# Patient Record
Sex: Female | Born: 1935 | Race: White | Hispanic: No | Smoking: Never smoker
Health system: Southern US, Community
[De-identification: ages and names within clinical notes are randomized; demographics above are authoritative.]

## PROBLEM LIST (undated history)

## (undated) DIAGNOSIS — M1990 Unspecified osteoarthritis, unspecified site: Secondary | ICD-10-CM

## (undated) DIAGNOSIS — I509 Heart failure, unspecified: Secondary | ICD-10-CM

## (undated) DIAGNOSIS — R42 Dizziness and giddiness: Secondary | ICD-10-CM

## (undated) DIAGNOSIS — E079 Disorder of thyroid, unspecified: Secondary | ICD-10-CM

## (undated) DIAGNOSIS — R011 Cardiac murmur, unspecified: Secondary | ICD-10-CM

## (undated) DIAGNOSIS — Z923 Personal history of irradiation: Secondary | ICD-10-CM

## (undated) DIAGNOSIS — I10 Essential (primary) hypertension: Secondary | ICD-10-CM

## (undated) DIAGNOSIS — D649 Anemia, unspecified: Secondary | ICD-10-CM

## (undated) DIAGNOSIS — C50319 Malignant neoplasm of lower-inner quadrant of unspecified female breast: Secondary | ICD-10-CM

## (undated) HISTORY — DX: Anemia, unspecified: D64.9

## (undated) HISTORY — DX: Essential (primary) hypertension: I10

## (undated) HISTORY — PX: COSMETIC SURGERY: SHX468

## (undated) HISTORY — DX: Dizziness and giddiness: R42

## (undated) HISTORY — PX: APPENDECTOMY: SHX54

## (undated) HISTORY — PX: ABDOMINAL HYSTERECTOMY: SHX81

## (undated) HISTORY — DX: Unspecified osteoarthritis, unspecified site: M19.90

## (undated) HISTORY — DX: Malignant neoplasm of lower-inner quadrant of unspecified female breast: C50.319

## (undated) HISTORY — DX: Heart failure, unspecified: I50.9

## (undated) HISTORY — PX: EYE SURGERY: SHX253

## (undated) HISTORY — DX: Cardiac murmur, unspecified: R01.1

## (undated) HISTORY — PX: ERCP W/ SPHICTEROTOMY: SHX1523

## (undated) HISTORY — DX: Disorder of thyroid, unspecified: E07.9

---

## 2007-04-22 ENCOUNTER — Ambulatory Visit: Payer: Self-pay | Admitting: Family Medicine

## 2007-06-28 ENCOUNTER — Ambulatory Visit: Payer: Self-pay | Admitting: Ophthalmology

## 2007-08-10 ENCOUNTER — Ambulatory Visit: Payer: Self-pay | Admitting: Ophthalmology

## 2008-10-16 ENCOUNTER — Ambulatory Visit: Payer: Self-pay | Admitting: Family Medicine

## 2008-10-24 ENCOUNTER — Ambulatory Visit: Payer: Self-pay | Admitting: Family Medicine

## 2010-01-17 ENCOUNTER — Ambulatory Visit: Payer: Self-pay | Admitting: Family Medicine

## 2010-09-07 DIAGNOSIS — C50319 Malignant neoplasm of lower-inner quadrant of unspecified female breast: Secondary | ICD-10-CM

## 2010-09-07 HISTORY — PX: BREAST BIOPSY: SHX20

## 2010-09-07 HISTORY — DX: Malignant neoplasm of lower-inner quadrant of unspecified female breast: C50.319

## 2010-09-07 HISTORY — PX: BREAST LUMPECTOMY: SHX2

## 2011-04-13 ENCOUNTER — Ambulatory Visit: Payer: Self-pay | Admitting: Family Medicine

## 2011-04-17 ENCOUNTER — Ambulatory Visit: Payer: Self-pay | Admitting: Family Medicine

## 2011-05-25 ENCOUNTER — Ambulatory Visit: Payer: Self-pay | Admitting: General Surgery

## 2011-05-26 LAB — CEA: CEA: 3.8 ng/mL (ref 0.0–4.7)

## 2011-05-26 LAB — CANCER ANTIGEN 27.29: CA 27.29: 31.9 U/mL (ref 0.0–38.6)

## 2011-06-02 ENCOUNTER — Ambulatory Visit: Payer: Self-pay | Admitting: General Surgery

## 2011-06-08 ENCOUNTER — Ambulatory Visit: Payer: Self-pay | Admitting: Radiation Oncology

## 2011-06-24 ENCOUNTER — Ambulatory Visit: Payer: Self-pay | Admitting: Radiation Oncology

## 2011-07-09 ENCOUNTER — Ambulatory Visit: Payer: Self-pay | Admitting: Radiation Oncology

## 2011-08-04 ENCOUNTER — Ambulatory Visit: Payer: Self-pay | Admitting: Internal Medicine

## 2011-08-08 ENCOUNTER — Ambulatory Visit: Payer: Self-pay | Admitting: Radiation Oncology

## 2011-09-08 ENCOUNTER — Ambulatory Visit: Payer: Self-pay | Admitting: Radiation Oncology

## 2011-09-08 HISTORY — PX: COLONOSCOPY: SHX174

## 2011-09-08 HISTORY — PX: UPPER GI ENDOSCOPY: SHX6162

## 2011-09-22 ENCOUNTER — Ambulatory Visit: Payer: Self-pay | Admitting: General Surgery

## 2011-09-29 ENCOUNTER — Ambulatory Visit: Payer: Self-pay | Admitting: General Surgery

## 2011-09-29 LAB — IRON AND TIBC
Iron Bind.Cap.(Total): 420 ug/dL (ref 250–450)
Unbound Iron-Bind.Cap.: 374 ug/dL

## 2011-10-08 ENCOUNTER — Ambulatory Visit: Payer: Self-pay | Admitting: Radiation Oncology

## 2011-10-09 ENCOUNTER — Ambulatory Visit: Payer: Self-pay | Admitting: Internal Medicine

## 2011-10-27 LAB — CBC CANCER CENTER
Basophil #: 0 x10 3/mm (ref 0.0–0.1)
Basophil %: 0.9 %
Eosinophil %: 9.8 %
HCT: 40.8 % (ref 35.0–47.0)
HGB: 13 g/dL (ref 12.0–16.0)
Lymphocyte #: 1.2 x10 3/mm (ref 1.0–3.6)
Lymphocyte %: 22.4 %
MCHC: 31.8 g/dL — ABNORMAL LOW (ref 32.0–36.0)
MCV: 87.7 fL (ref 80–100)
Monocyte #: 0.6 x10 3/mm (ref 0.0–0.7)
Monocyte %: 11.1 %
Neutrophil #: 3.1 x10 3/mm (ref 1.4–6.5)
RBC: 4.65 10*6/uL (ref 3.80–5.20)
RDW: 19.3 % — ABNORMAL HIGH (ref 11.5–14.5)
WBC: 5.4 x10 3/mm (ref 3.6–11.0)

## 2011-10-27 LAB — IRON AND TIBC: Iron: 99 ug/dL (ref 50–170)

## 2011-10-27 LAB — HEPATIC FUNCTION PANEL A (ARMC)
Alkaline Phosphatase: 67 U/L (ref 50–136)
Bilirubin, Direct: 0.1 mg/dL (ref 0.00–0.20)
Bilirubin,Total: 0.4 mg/dL (ref 0.2–1.0)
SGOT(AST): 18 U/L (ref 15–37)

## 2011-10-27 LAB — CREATININE, SERUM: EGFR (Non-African Amer.): 41 — ABNORMAL LOW

## 2011-10-27 LAB — RETICULOCYTES
Absolute Retic Count: 0.1 10*6/uL — ABNORMAL HIGH (ref 0.024–0.084)
Reticulocyte: 2.3 % — ABNORMAL HIGH (ref 0.5–1.5)

## 2011-10-27 LAB — FERRITIN: Ferritin (ARMC): 22 ng/mL (ref 8–388)

## 2011-11-06 ENCOUNTER — Ambulatory Visit: Payer: Self-pay | Admitting: Internal Medicine

## 2011-12-07 ENCOUNTER — Ambulatory Visit: Payer: Self-pay | Admitting: Internal Medicine

## 2012-01-23 IMAGING — CT CT SIM MISC
1 series · 16 of 34 positions shown, 20 images · non-contrast
Comparison: none

[Series 2: — · axial · 1.17mm/px · z∈[-828,-552]mm · 16 of 102 slices shown, 20 images]
[im 8/102  mediastinal]
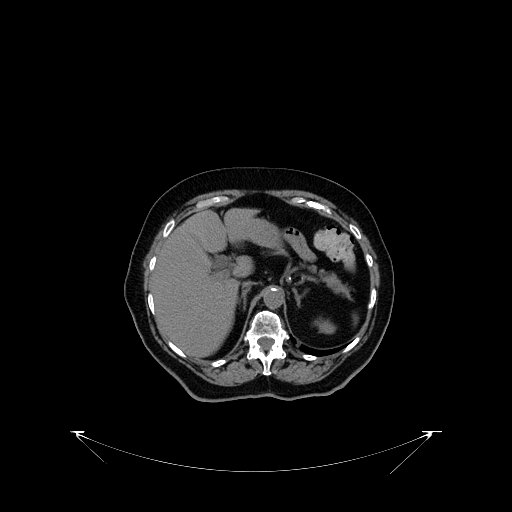
[im 8/102  lung]
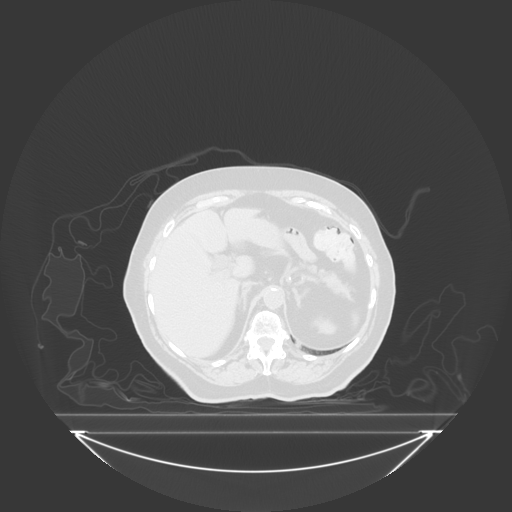
[im 15/102  lung]
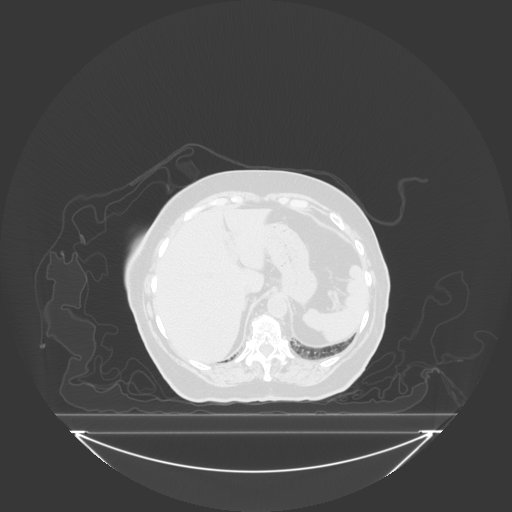
[im 21/102  lung]
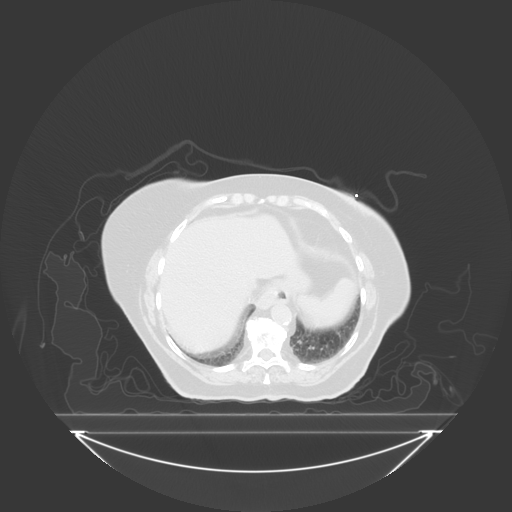
[im 27/102  lung]
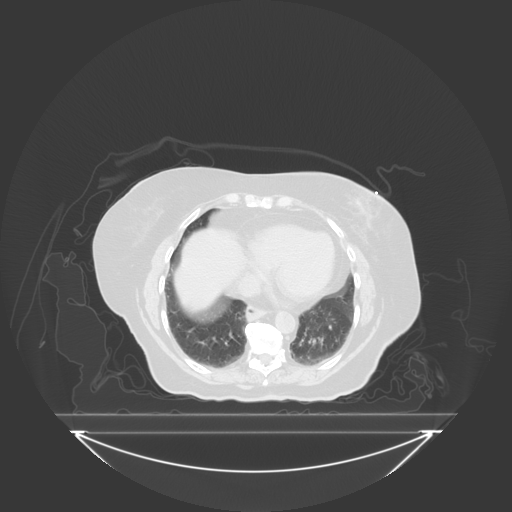
[im 34/102  mediastinal]
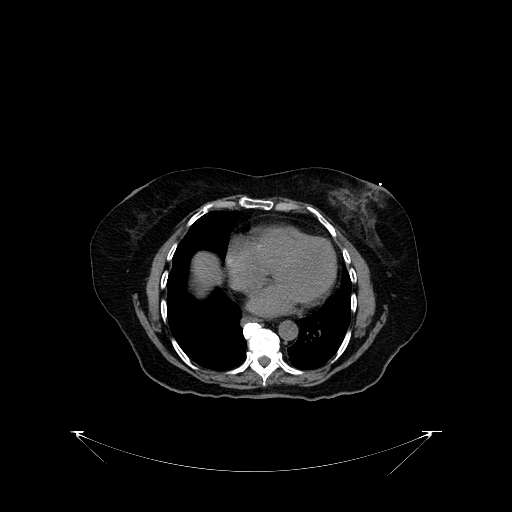
[im 34/102  lung]
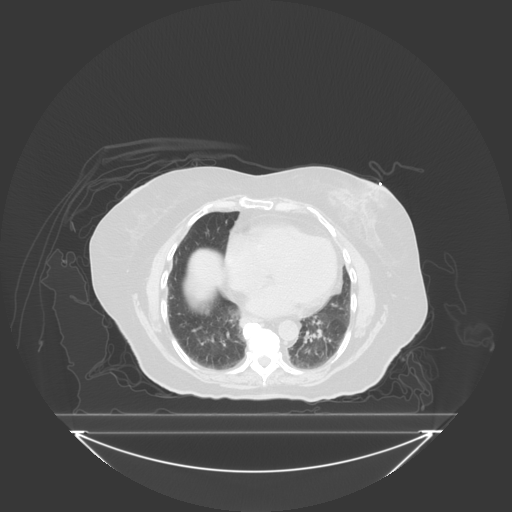
[im 41/102  lung]
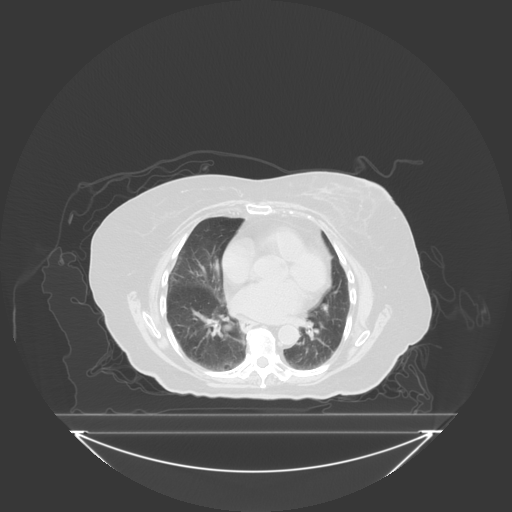
[im 45/102  lung]
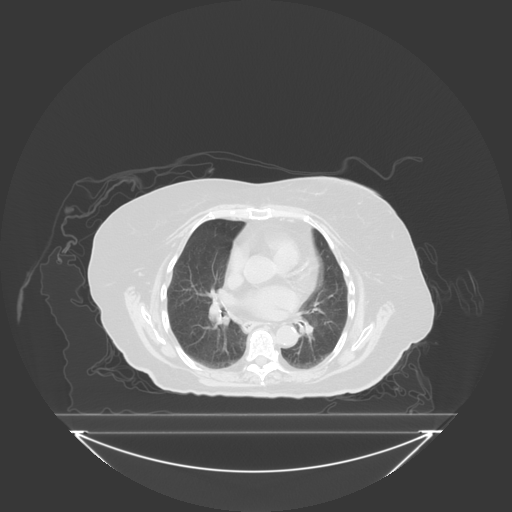
[im 50/102  lung]
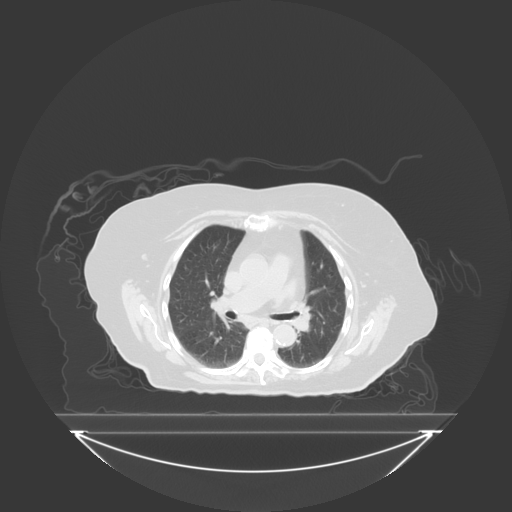
[im 56/102  mediastinal]
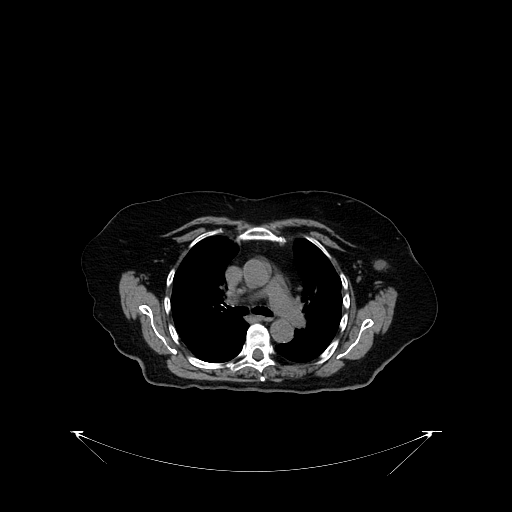
[im 56/102  lung]
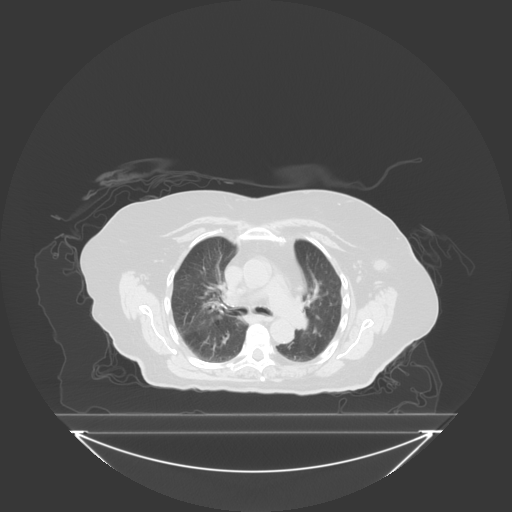
[im 60/102  lung]
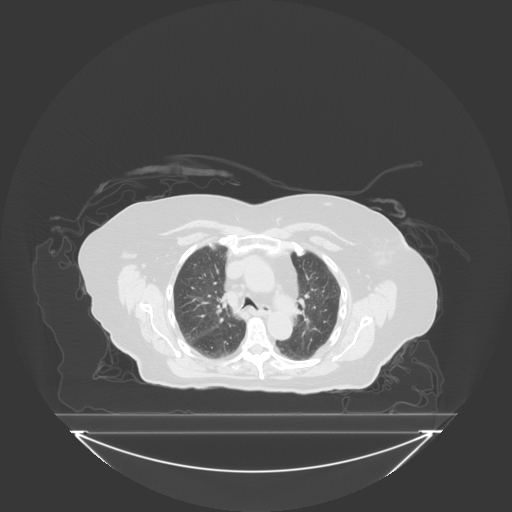
[im 64/102  lung]
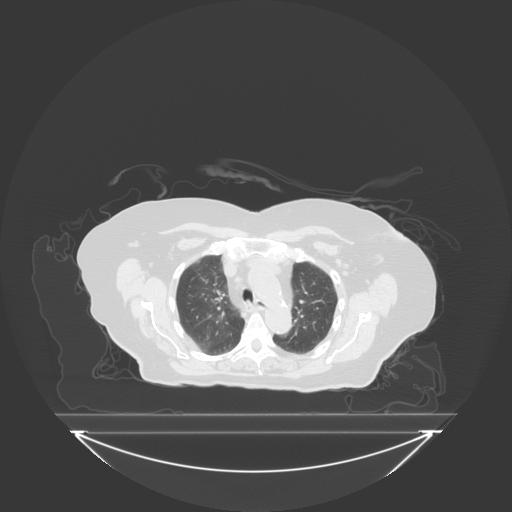
[im 72/102  lung]
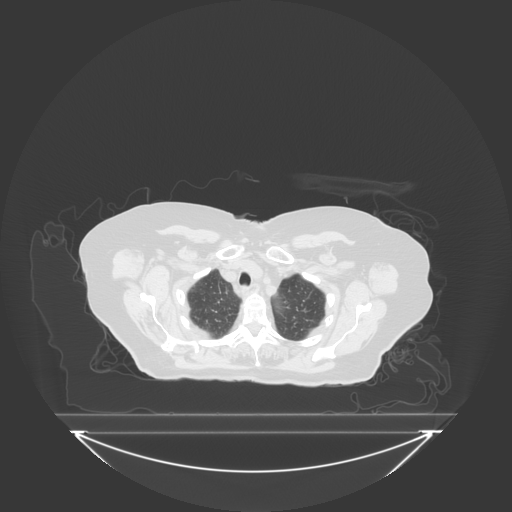
[im 79/102  mediastinal]
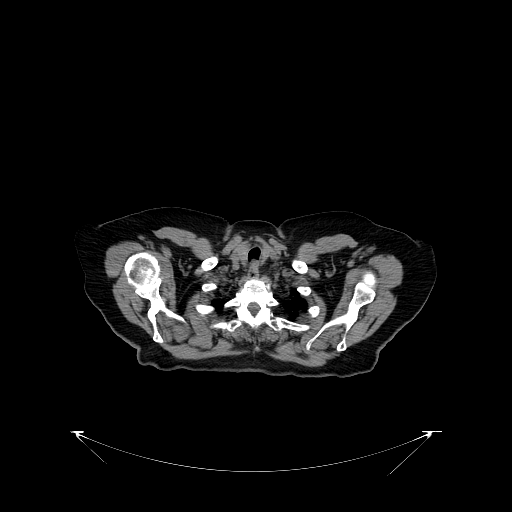
[im 79/102  lung]
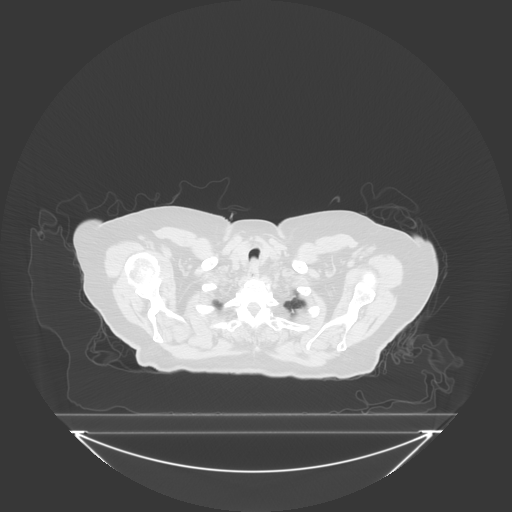
[im 83/102  lung]
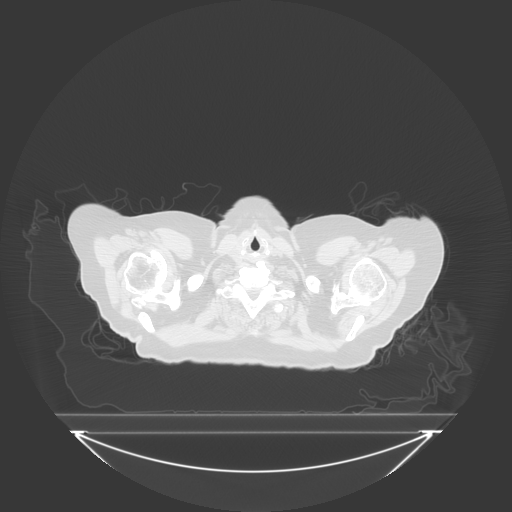
[im 90/102  lung]
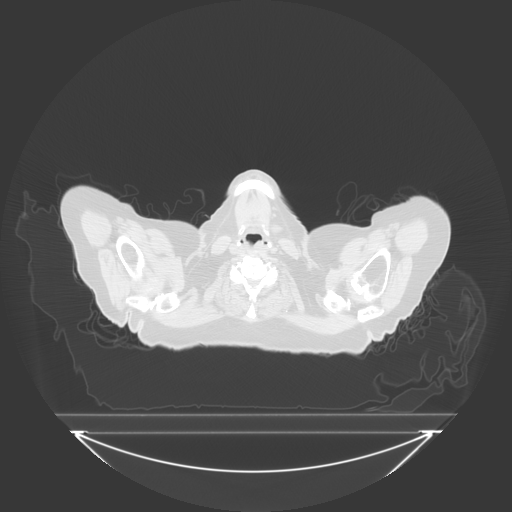
[im 98/102  lung]
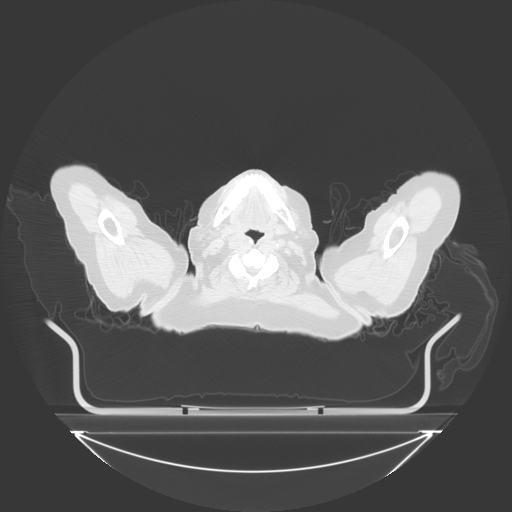

[16 of 34 positions shown; findings below may reference images not displayed]

IMAGES IMPORTED FROM THE [employer] WORKFLOW SYSTEM
NO DICTATION FOR STUDY

## 2012-03-03 ENCOUNTER — Ambulatory Visit: Payer: Self-pay | Admitting: Internal Medicine

## 2012-03-07 ENCOUNTER — Ambulatory Visit: Payer: Self-pay | Admitting: Internal Medicine

## 2012-04-20 ENCOUNTER — Ambulatory Visit: Payer: Self-pay | Admitting: General Surgery

## 2012-05-24 ENCOUNTER — Inpatient Hospital Stay: Payer: Self-pay | Admitting: Specialist

## 2012-05-24 LAB — CBC
HCT: 39.3 % (ref 35.0–47.0)
HGB: 13.1 g/dL (ref 12.0–16.0)
MCHC: 33.3 g/dL (ref 32.0–36.0)
MCV: 93 fL (ref 80–100)
RDW: 14.3 % (ref 11.5–14.5)
WBC: 7.7 10*3/uL (ref 3.6–11.0)

## 2012-05-24 LAB — CK TOTAL AND CKMB (NOT AT ARMC)
CK, Total: 129 U/L (ref 21–215)
CK, Total: 149 U/L (ref 21–215)
CK-MB: 7 ng/mL — ABNORMAL HIGH (ref 0.5–3.6)
CK-MB: 6.5 ng/mL — ABNORMAL HIGH (ref 0.5–3.6)

## 2012-05-24 LAB — BASIC METABOLIC PANEL
Anion Gap: 9 (ref 7–16)
Calcium, Total: 9.3 mg/dL (ref 8.5–10.1)
Chloride: 109 mmol/L — ABNORMAL HIGH (ref 98–107)
Co2: 23 mmol/L (ref 21–32)
Glucose: 100 mg/dL — ABNORMAL HIGH (ref 65–99)
Osmolality: 283 (ref 275–301)
Potassium: 4 mmol/L (ref 3.5–5.1)

## 2012-05-24 LAB — TROPONIN I: Troponin-I: <0.02 ng/mL

## 2012-05-25 LAB — BASIC METABOLIC PANEL
Anion Gap: 10 (ref 7–16)
BUN: 17 mg/dL (ref 7–18)
Calcium, Total: 9.8 mg/dL (ref 8.5–10.1)
Chloride: 106 mmol/L (ref 98–107)
Co2: 28 mmol/L (ref 21–32)
Osmolality: 289 (ref 275–301)
Potassium: 3.6 mmol/L (ref 3.5–5.1)

## 2012-05-25 LAB — CBC WITH DIFFERENTIAL/PLATELET
Basophil %: 1 %
Eosinophil %: 5.1 %
HGB: 12.7 g/dL (ref 12.0–16.0)
Lymphocyte #: 1.6 10*3/uL (ref 1.0–3.6)
MCV: 92 fL (ref 80–100)
Monocyte %: 12.9 %
Neutrophil %: 52.9 %
Platelet: 197 10*3/uL (ref 150–440)
RBC: 4.02 10*6/uL (ref 3.80–5.20)
WBC: 5.7 10*3/uL (ref 3.6–11.0)

## 2012-05-25 LAB — HEPATIC FUNCTION PANEL A (ARMC)
Albumin: 3.6 g/dL (ref 3.4–5.0)
Alkaline Phosphatase: 51 U/L (ref 50–136)
SGOT(AST): 21 U/L (ref 15–37)
SGPT (ALT): 38 U/L (ref 12–78)

## 2012-05-25 LAB — TROPONIN I: Troponin-I: <0.02 ng/mL

## 2012-05-25 LAB — CK TOTAL AND CKMB (NOT AT ARMC)
CK, Total: 139 U/L (ref 21–215)
CK-MB: 4.6 ng/mL — ABNORMAL HIGH (ref 0.5–3.6)

## 2012-05-26 LAB — BASIC METABOLIC PANEL
BUN: 26 mg/dL — ABNORMAL HIGH (ref 7–18)
Calcium, Total: 10.3 mg/dL — ABNORMAL HIGH (ref 8.5–10.1)
Chloride: 103 mmol/L (ref 98–107)
Co2: 30 mmol/L (ref 21–32)
EGFR (African American): 50 — ABNORMAL LOW
Osmolality: 289 (ref 275–301)
Potassium: 3.9 mmol/L (ref 3.5–5.1)
Sodium: 142 mmol/L (ref 136–145)

## 2012-06-24 ENCOUNTER — Inpatient Hospital Stay: Payer: Self-pay | Admitting: Internal Medicine

## 2012-06-24 LAB — URINALYSIS, COMPLETE
Bacteria: NONE SEEN
Bilirubin,UR: NEGATIVE
Blood: NEGATIVE
Glucose,UR: NEGATIVE mg/dL (ref 0–75)
Ketone: NEGATIVE
Nitrite: NEGATIVE
Ph: 5 (ref 4.5–8.0)
Protein: NEGATIVE
RBC,UR: 1 /HPF (ref 0–5)
Specific Gravity: 1.014 (ref 1.003–1.030)
Squamous Epithelial: 2
WBC UR: 3 /HPF (ref 0–5)

## 2012-06-24 LAB — BASIC METABOLIC PANEL
BUN: 122 mg/dL — ABNORMAL HIGH (ref 7–18)
Co2: 22 mmol/L (ref 21–32)
Creatinine: 1.9 mg/dL — ABNORMAL HIGH (ref 0.60–1.30)
EGFR (Non-African Amer.): 24 — ABNORMAL LOW
Glucose: 144 mg/dL — ABNORMAL HIGH (ref 65–99)
Potassium: 4.9 mmol/L (ref 3.5–5.1)
Sodium: 143 mmol/L (ref 136–145)

## 2012-06-24 LAB — COMPREHENSIVE METABOLIC PANEL
Albumin: 3.2 g/dL — ABNORMAL LOW (ref 3.4–5.0)
Anion Gap: 10 (ref 7–16)
BUN: 130 mg/dL — ABNORMAL HIGH (ref 7–18)
Calcium, Total: 8.8 mg/dL (ref 8.5–10.1)
Co2: 23 mmol/L (ref 21–32)
Creatinine: 1.92 mg/dL — ABNORMAL HIGH (ref 0.60–1.30)
EGFR (African American): 27 — ABNORMAL LOW
EGFR (Non-African Amer.): 23 — ABNORMAL LOW
Potassium: 5.6 mmol/L — ABNORMAL HIGH (ref 3.5–5.1)
SGOT(AST): 7 U/L — ABNORMAL LOW (ref 15–37)
SGPT (ALT): 17 U/L (ref 12–78)
Total Protein: 5.9 g/dL — ABNORMAL LOW (ref 6.4–8.2)

## 2012-06-24 LAB — CBC
HGB: 7.9 g/dL — ABNORMAL LOW (ref 12.0–16.0)
MCH: 30.5 pg (ref 26.0–34.0)
MCHC: 33.2 g/dL (ref 32.0–36.0)
MCV: 92 fL (ref 80–100)
Platelet: 197 10*3/uL (ref 150–440)
RDW: 13.6 % (ref 11.5–14.5)
WBC: 12.2 10*3/uL — ABNORMAL HIGH (ref 3.6–11.0)

## 2012-06-24 LAB — HEMOGLOBIN: HGB: 6.8 g/dL — ABNORMAL LOW (ref 12.0–16.0)

## 2012-06-24 LAB — MAGNESIUM: Magnesium: 1.9 mg/dL

## 2012-06-24 LAB — CK TOTAL AND CKMB (NOT AT ARMC)
CK, Total: 79 U/L
CK-MB: 3.3 ng/mL

## 2012-06-24 LAB — APTT: Activated PTT: 24.7 secs (ref 23.6–35.9)

## 2012-06-24 LAB — PRO B NATRIURETIC PEPTIDE: B-Type Natriuretic Peptide: 189 pg/mL (ref 0–450)

## 2012-06-24 LAB — LIPASE, BLOOD: Lipase: 141 U/L (ref 73–393)

## 2012-06-24 LAB — PROTIME-INR: INR: 1

## 2012-06-25 LAB — CBC WITH DIFFERENTIAL/PLATELET
Basophil #: 0.1 x10 3/mm 3
Basophil %: 0.7 %
Eosinophil #: 0.5 x10 3/mm 3
Eosinophil %: 5.5 %
HCT: 27.1 % — ABNORMAL LOW
HGB: 9.3 g/dL — ABNORMAL LOW
Lymphocyte %: 26.8 %
Lymphs Abs: 2.3 x10 3/mm 3
MCH: 30.5 pg
MCHC: 34.1 g/dL
MCV: 89 fL
Monocyte #: 0.8 "x10 3/mm "
Monocyte %: 8.8 %
Neutrophil #: 5 x10 3/mm 3
Neutrophil %: 58.2 %
Platelet: 123 x10 3/mm 3 — ABNORMAL LOW
RBC: 3.04 X10 6/mm 3 — ABNORMAL LOW
RDW: 14.2 %
WBC: 8.6 x10 3/mm 3

## 2012-06-25 LAB — BASIC METABOLIC PANEL
BUN: 108 mg/dL — ABNORMAL HIGH (ref 7–18)
Calcium, Total: 8 mg/dL — ABNORMAL LOW (ref 8.5–10.1)
EGFR (African American): 30 — ABNORMAL LOW
EGFR (Non-African Amer.): 26 — ABNORMAL LOW
Glucose: 119 mg/dL — ABNORMAL HIGH (ref 65–99)
Sodium: 146 mmol/L — ABNORMAL HIGH (ref 136–145)

## 2012-06-25 LAB — HEMOGLOBIN
HGB: 8.7 g/dL — ABNORMAL LOW (ref 12.0–16.0)
HGB: 6.9 g/dL — ABNORMAL LOW (ref 12.0–16.0)

## 2012-06-26 LAB — BASIC METABOLIC PANEL
Anion Gap: 11 (ref 7–16)
BUN: 113 mg/dL — ABNORMAL HIGH (ref 7–18)
BUN: 108 mg/dL — ABNORMAL HIGH (ref 7–18)
Calcium, Total: 7.9 mg/dL — ABNORMAL LOW (ref 8.5–10.1)
Co2: 18 mmol/L — ABNORMAL LOW (ref 21–32)
Creatinine: 1.81 mg/dL — ABNORMAL HIGH (ref 0.60–1.30)
EGFR (African American): 29 — ABNORMAL LOW
EGFR (African American): 30 — ABNORMAL LOW
EGFR (Non-African Amer.): 26 — ABNORMAL LOW
Glucose: 167 mg/dL — ABNORMAL HIGH (ref 65–99)
Osmolality: 327 (ref 275–301)
Potassium: 5.4 mmol/L — ABNORMAL HIGH (ref 3.5–5.1)
Potassium: 4.6 mmol/L (ref 3.5–5.1)
Sodium: 144 mmol/L (ref 136–145)

## 2012-06-26 LAB — CBC WITH DIFFERENTIAL/PLATELET
Basophil #: 0 10*3/uL (ref 0.0–0.1)
Eosinophil #: 0.5 10*3/uL (ref 0.0–0.7)
Eosinophil %: 4.5 %
HGB: 7.6 g/dL — ABNORMAL LOW (ref 12.0–16.0)
Lymphocyte #: 2 10*3/uL (ref 1.0–3.6)
MCH: 30 pg (ref 26.0–34.0)
MCHC: 33.2 g/dL (ref 32.0–36.0)
MCV: 90 fL (ref 80–100)
Monocyte #: 0.9 x10 3/mm (ref 0.2–0.9)
Neutrophil %: 70 %
Platelet: 117 10*3/uL — ABNORMAL LOW (ref 150–440)
RDW: 14.4 % (ref 11.5–14.5)

## 2012-06-27 LAB — BASIC METABOLIC PANEL
Calcium, Total: 7.8 mg/dL — ABNORMAL LOW (ref 8.5–10.1)
Chloride: 117 mmol/L — ABNORMAL HIGH (ref 98–107)
Co2: 22 mmol/L (ref 21–32)
EGFR (African American): 35 — ABNORMAL LOW
EGFR (Non-African Amer.): 30 — ABNORMAL LOW
Glucose: 128 mg/dL — ABNORMAL HIGH (ref 65–99)
Potassium: 4.6 mmol/L (ref 3.5–5.1)
Sodium: 149 mmol/L — ABNORMAL HIGH (ref 136–145)

## 2012-06-27 LAB — CBC WITH DIFFERENTIAL/PLATELET
Basophil %: 0.2 %
Eosinophil %: 0.1 %
HCT: 22.8 % — ABNORMAL LOW (ref 35.0–47.0)
HGB: 7.7 g/dL — ABNORMAL LOW (ref 12.0–16.0)
Lymphocyte #: 1.2 10*3/uL (ref 1.0–3.6)
MCH: 30.6 pg (ref 26.0–34.0)
MCV: 91 fL (ref 80–100)
Monocyte #: 0.8 x10 3/mm (ref 0.2–0.9)
Monocyte %: 5.4 %
Neutrophil %: 86 %
RBC: 2.51 10*6/uL — ABNORMAL LOW (ref 3.80–5.20)
WBC: 14.8 10*3/uL — ABNORMAL HIGH (ref 3.6–11.0)

## 2012-06-28 LAB — CBC WITH DIFFERENTIAL/PLATELET
Eosinophil %: 5.9 %
HCT: 21.1 % — ABNORMAL LOW (ref 35.0–47.0)
HGB: 7 g/dL — ABNORMAL LOW (ref 12.0–16.0)
Lymphocyte #: 1.1 10*3/uL (ref 1.0–3.6)
Lymphocyte %: 10.4 %
MCH: 30.4 pg (ref 26.0–34.0)
MCV: 91 fL (ref 80–100)
Monocyte #: 0.5 x10 3/mm (ref 0.2–0.9)
Monocyte %: 5.3 %
Neutrophil %: 78 %
Platelet: 123 10*3/uL — ABNORMAL LOW (ref 150–440)
RBC: 2.32 10*6/uL — ABNORMAL LOW (ref 3.80–5.20)

## 2012-06-28 LAB — BASIC METABOLIC PANEL
Calcium, Total: 8.1 mg/dL — ABNORMAL LOW (ref 8.5–10.1)
Chloride: 115 mmol/L — ABNORMAL HIGH (ref 98–107)
Co2: 22 mmol/L (ref 21–32)
Creatinine: 1.13 mg/dL (ref 0.60–1.30)
EGFR (Non-African Amer.): 44 — ABNORMAL LOW
Glucose: 153 mg/dL — ABNORMAL HIGH (ref 65–99)
Osmolality: 298 (ref 275–301)
Potassium: 3.8 mmol/L (ref 3.5–5.1)
Sodium: 145 mmol/L (ref 136–145)

## 2012-06-29 LAB — CBC WITH DIFFERENTIAL/PLATELET
Basophil #: 0 10*3/uL (ref 0.0–0.1)
Eosinophil %: 8 %
HCT: 20.7 % — ABNORMAL LOW (ref 35.0–47.0)
HGB: 7.1 g/dL — ABNORMAL LOW (ref 12.0–16.0)
Lymphocyte #: 1.1 10*3/uL (ref 1.0–3.6)
Lymphocyte %: 13.9 %
MCH: 31.1 pg (ref 26.0–34.0)
MCHC: 34.3 g/dL (ref 32.0–36.0)
MCV: 91 fL (ref 80–100)
Monocyte %: 5.8 %
Neutrophil %: 71.9 %
RDW: 14 % (ref 11.5–14.5)

## 2012-06-30 LAB — CBC WITH DIFFERENTIAL/PLATELET
Basophil #: 0 10*3/uL (ref 0.0–0.1)
Basophil %: 0.6 %
Eosinophil #: 0.5 10*3/uL (ref 0.0–0.7)
HCT: 21.7 % — ABNORMAL LOW (ref 35.0–47.0)
HGB: 7.4 g/dL — ABNORMAL LOW (ref 12.0–16.0)
Lymphocyte #: 1.3 10*3/uL (ref 1.0–3.6)
MCHC: 34.1 g/dL (ref 32.0–36.0)
MCV: 90 fL (ref 80–100)
Monocyte #: 0.6 x10 3/mm (ref 0.2–0.9)
Monocyte %: 8.8 %
Neutrophil #: 4 10*3/uL (ref 1.4–6.5)
RBC: 2.41 10*6/uL — ABNORMAL LOW (ref 3.80–5.20)
RDW: 14 % (ref 11.5–14.5)
WBC: 6.4 10*3/uL (ref 3.6–11.0)

## 2012-12-15 IMAGING — CR DG CHEST 2V
1 series · 2 of 2 positions shown · non-contrast
Comparison: none

REASON FOR EXAM: SOB
COMMENTS:

PROCEDURE:     DXR - DXR CHEST PA (OR AP) AND LATERAL  - May 24, 2012  [DATE]
RESULT:     Comparison: 05/25/2011

[Series 2: w chest pa · 0.14mm/px · 2 of 2 slices shown]
[im 1/2]
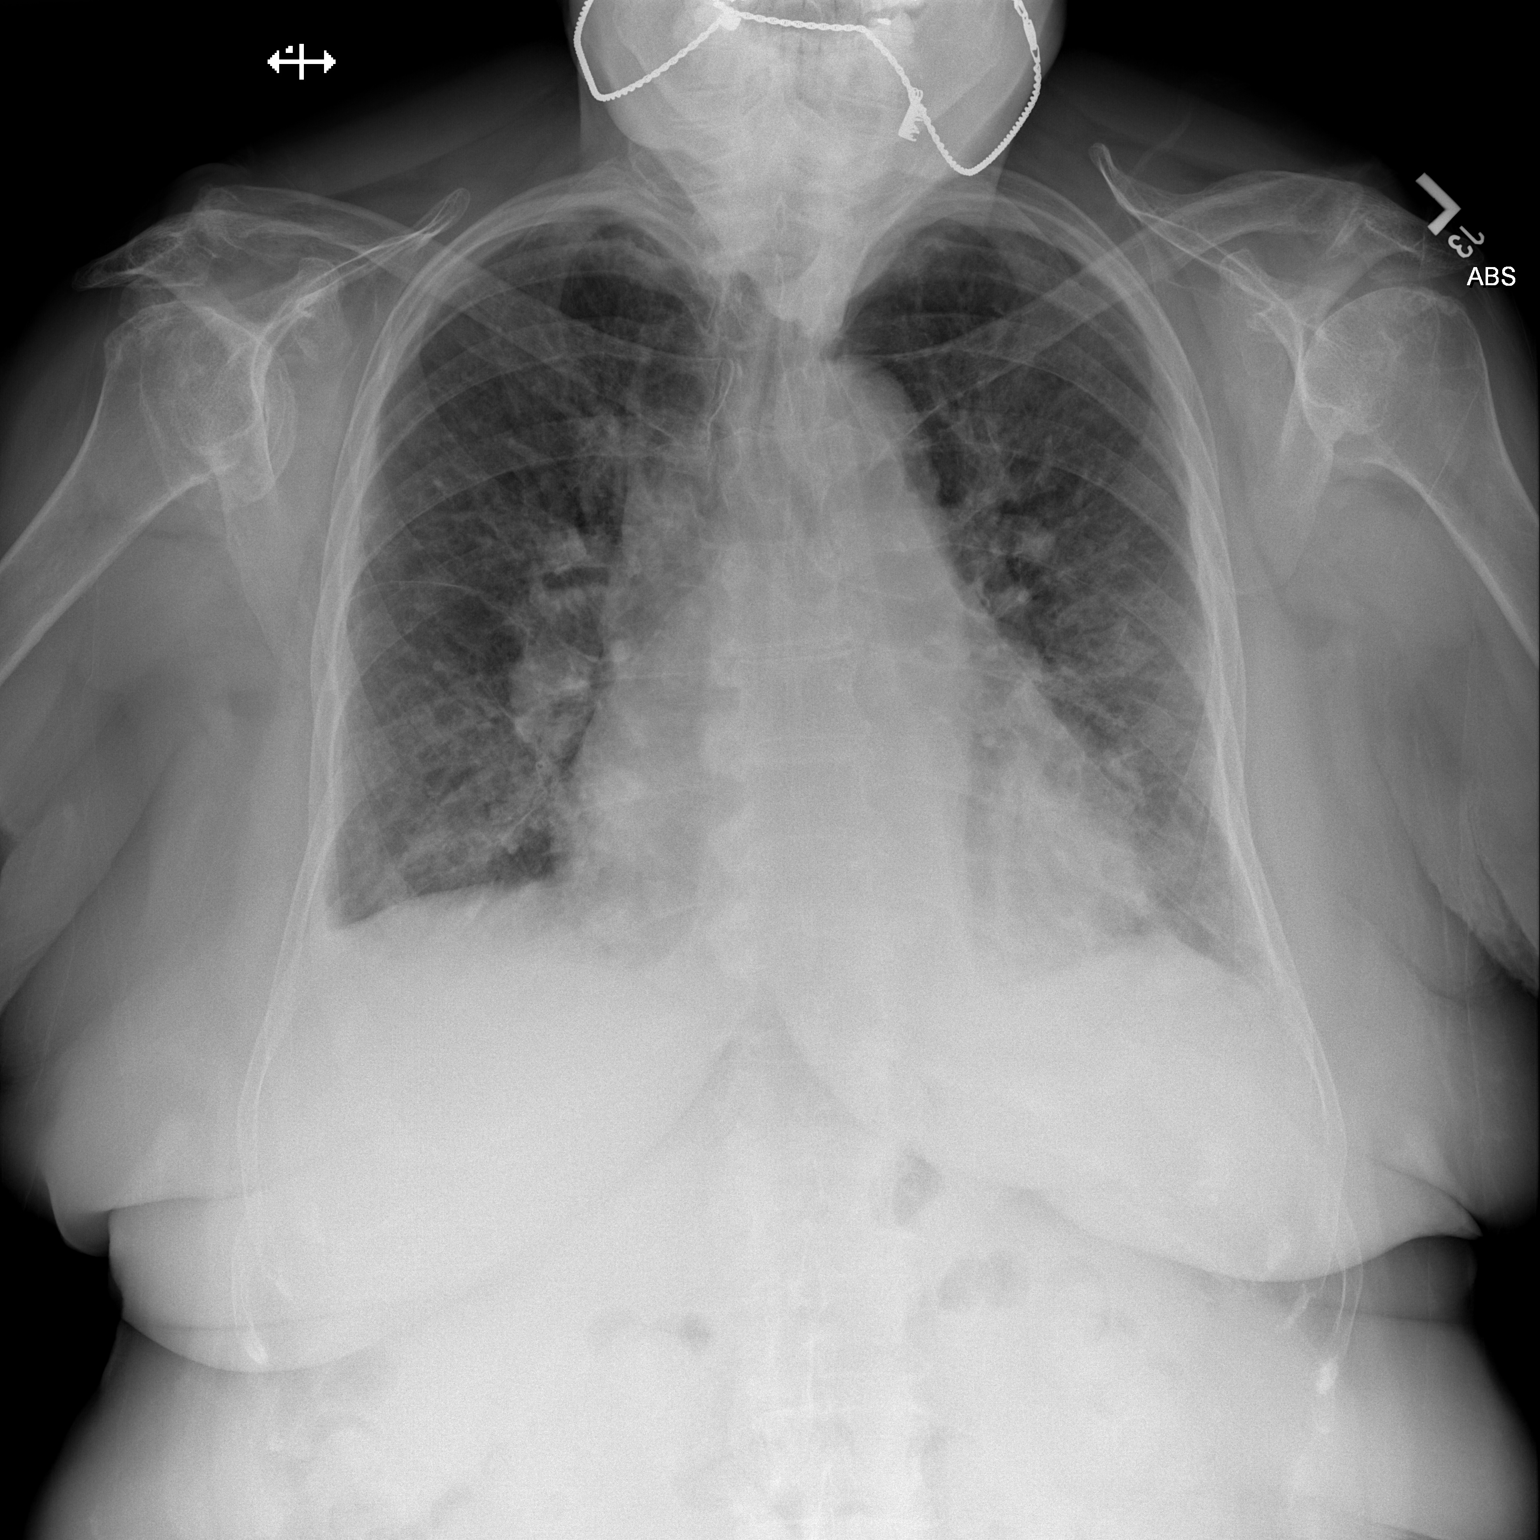
[im 2/2]
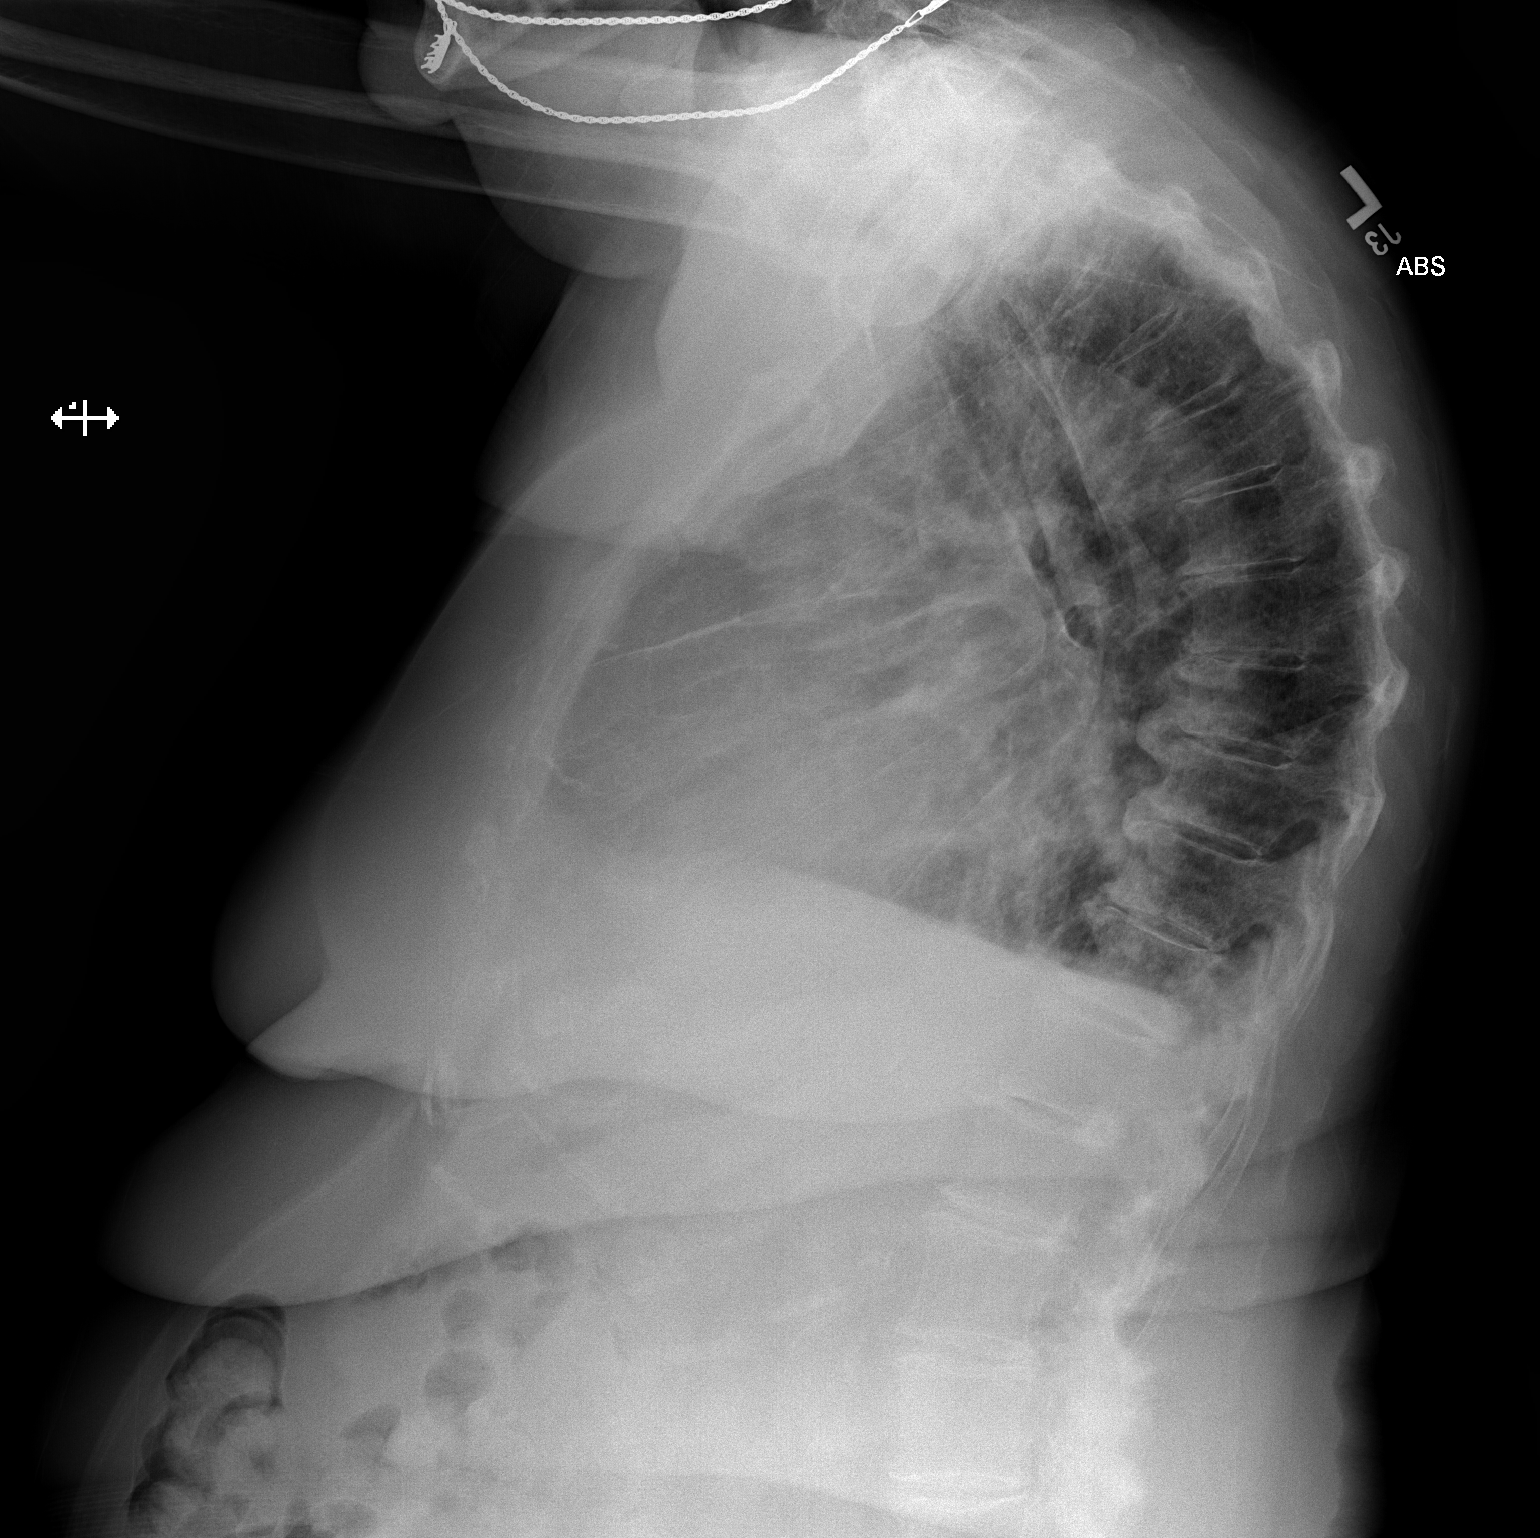

[2 of 2 positions shown; findings below may reference images not displayed]

FINDINGS: Cardiomegaly and the mediastinum are similar to prior. Prominence of the
right hilum is similar to prior likely secondary to enlarged central
pulmonary vasculature. There is bilateral interstitial pulmonary edema.
IMPRESSION: Interstitial pulmonary edema.

[REDACTED]

## 2013-01-15 IMAGING — CR DG CHEST 1V PORT
1 series · 1 of 1 positions shown · non-contrast
Comparison: none

REASON FOR EXAM: sob
COMMENTS:

[ap]
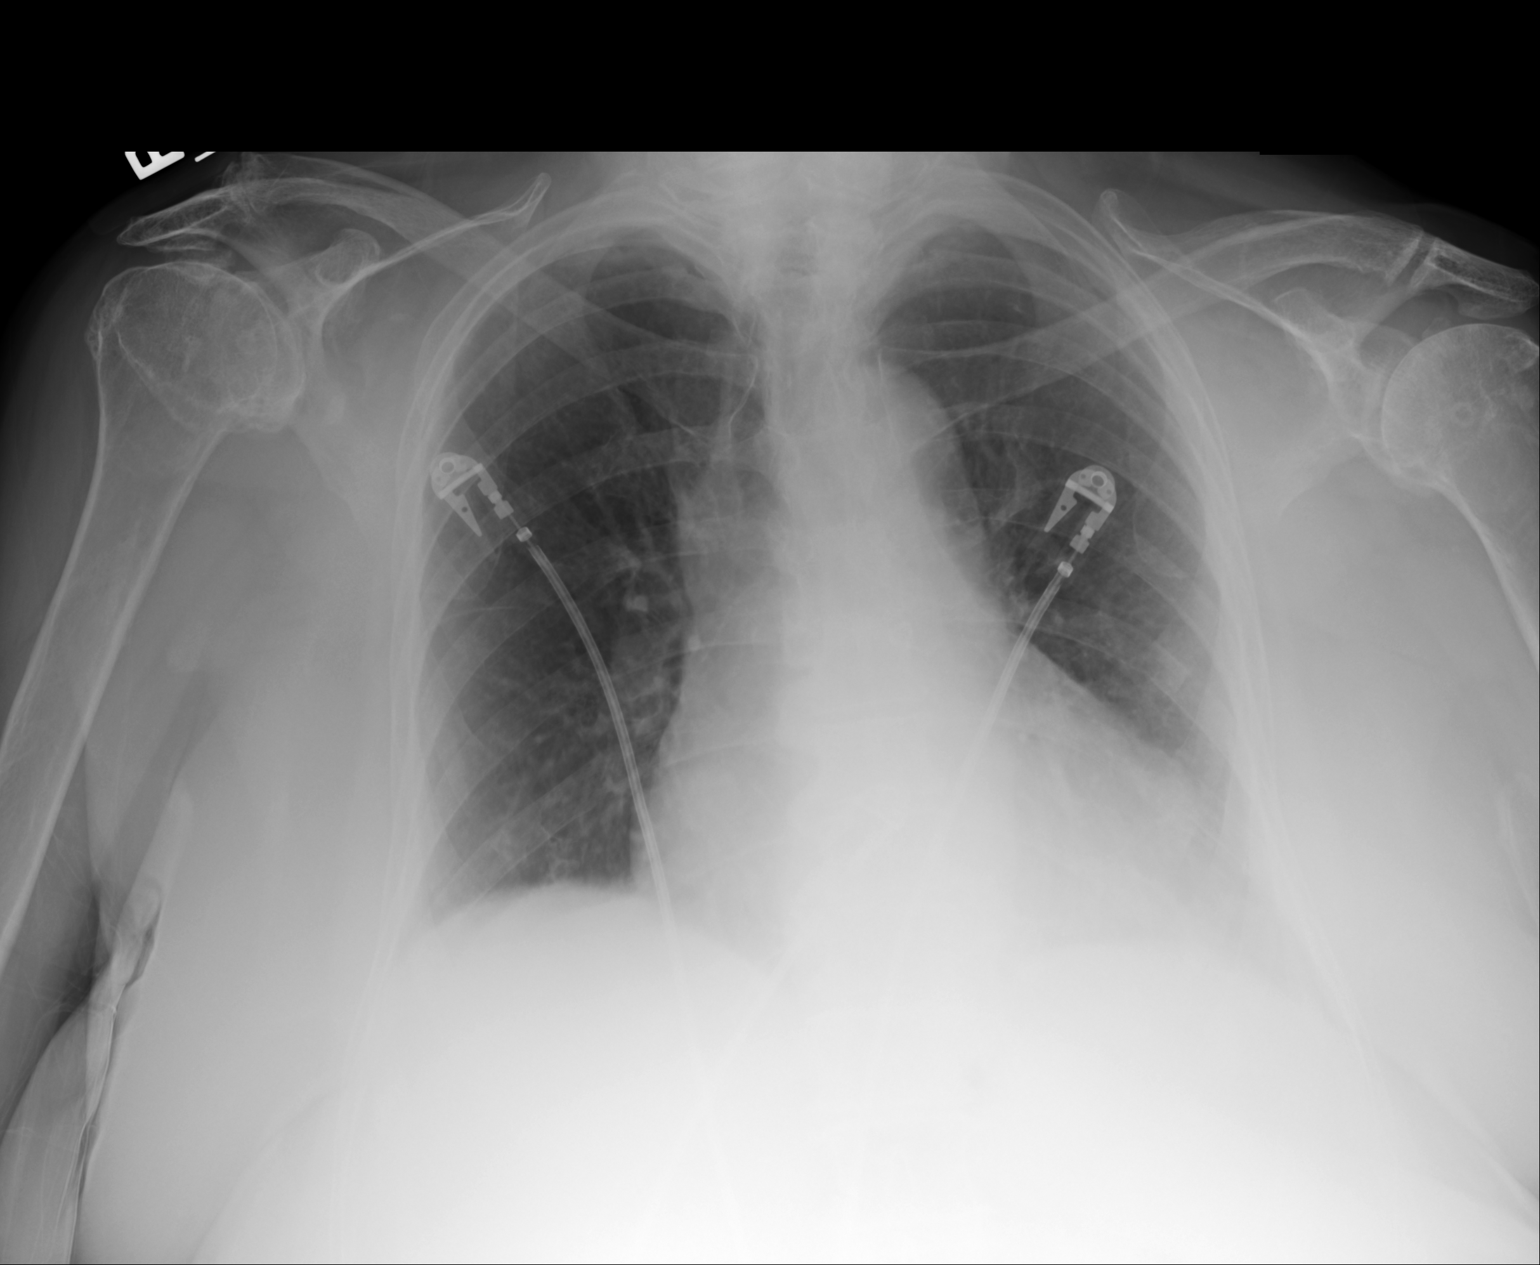

[1 of 1 positions shown; findings below may reference images not displayed]

PROCEDURE:     DXR - DXR PORTABLE CHEST SINGLE VIEW  - June 24, 2012  [DATE]

RESULT:     Comparison is made to the study 24 May, 2012.

The cardiac silhouette is borderline enlarged. Cardiac monitoring electrodes
are present. The lungs are clear. The pulmonary vessels are normal. The bony
and mediastinal structures are unremarkable. There is no effusion. There is
no pneumothorax or evidence of congestive failure.
IMPRESSION: No acute cardiopulmonary disease.

[REDACTED]

## 2013-01-17 IMAGING — US ABDOMEN ULTRASOUND LIMITED
1 series · 8 of 8 positions shown · non-contrast
Comparison: none

REASON FOR EXAM: r/o fluid collection in abdomen? Blood?
COMMENTS:   Body Site: Ascites search - Abd. Quadrants imaged for free fluid

PROCEDURE:     US  - US ABDOMEN LIMITED SURVEY  - June 26, 2012 [DATE]
RESULT:     Ultrasound interrogation of the abdomen was performed in all 4
quadrants. No discrete fluid collections are demonstrated.

[Series 1: abdomen ultrasound limited · 0.31mm/px · 8 of 8 slices shown]
[im 1/8]
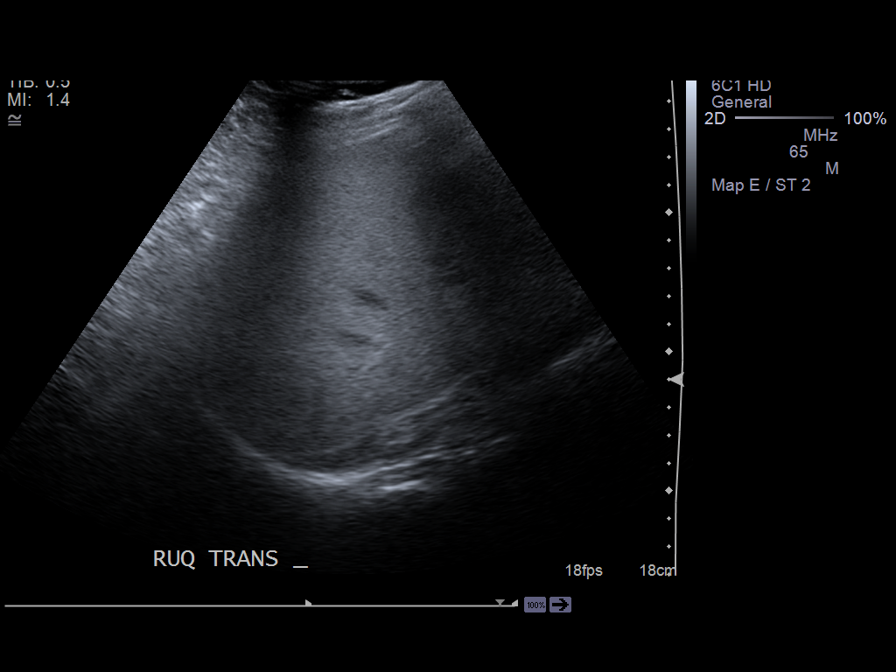
[im 2/8]
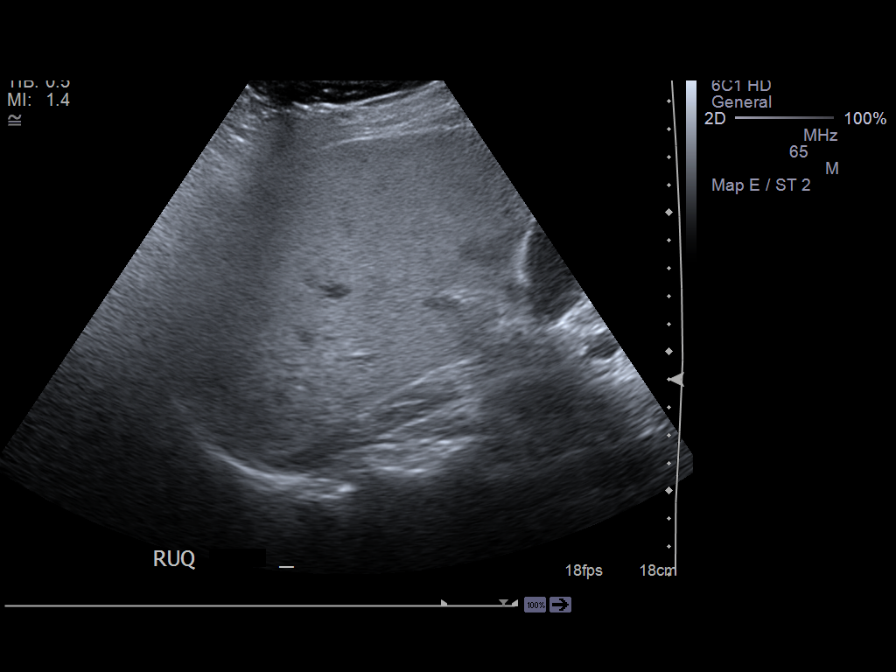
[im 3/8]
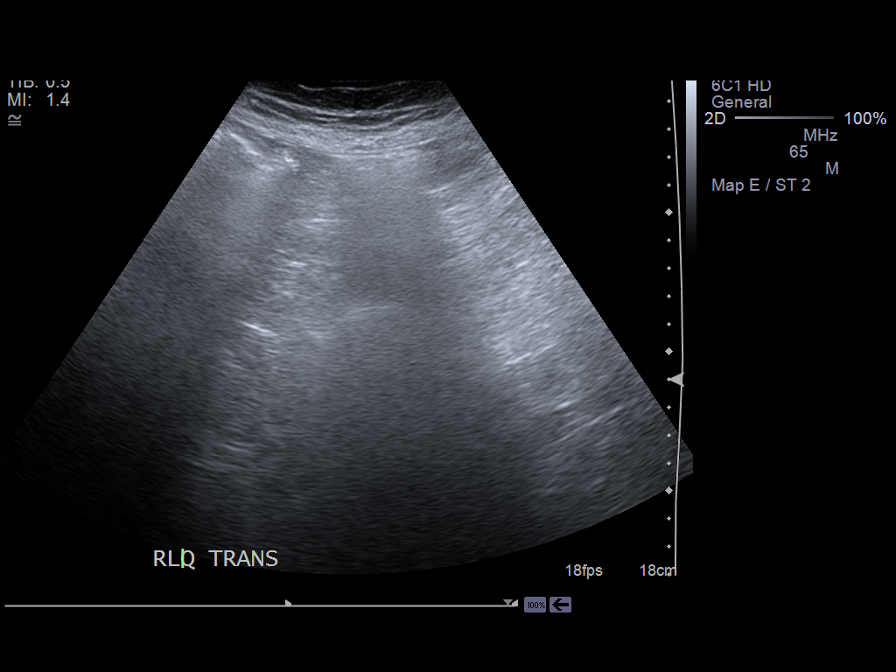
[im 4/8]
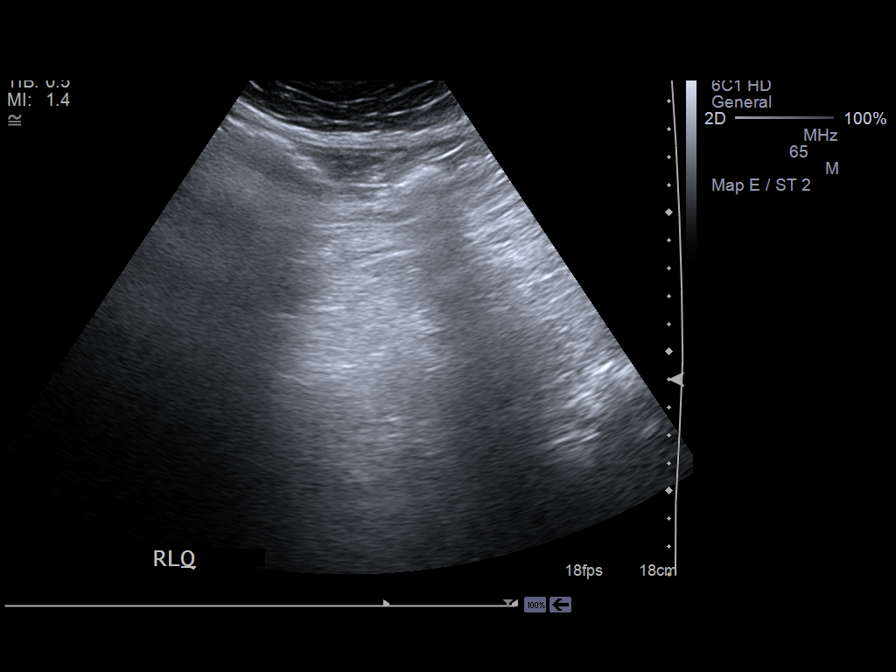
[im 5/8]
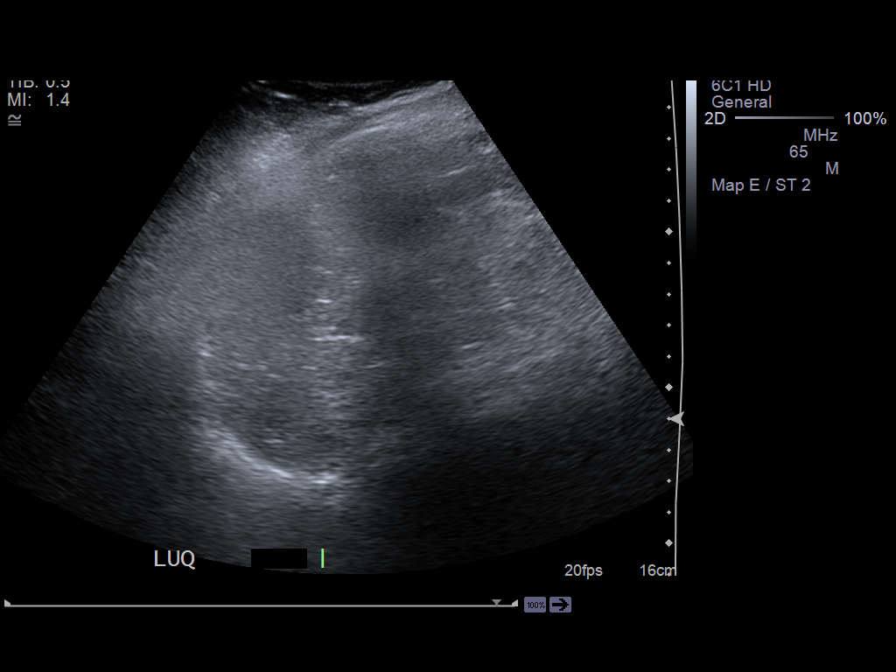
[im 6/8]
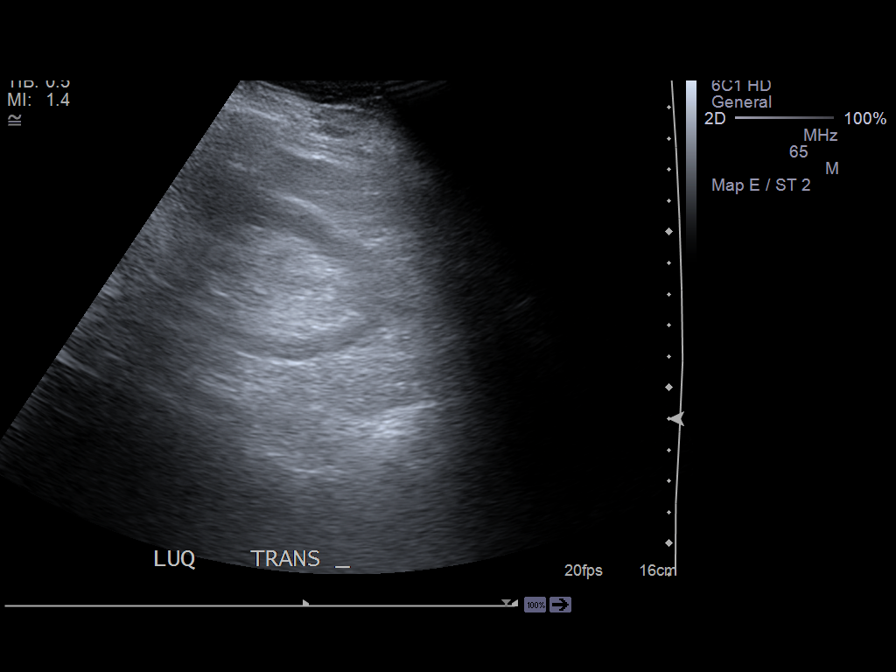
[im 7/8]
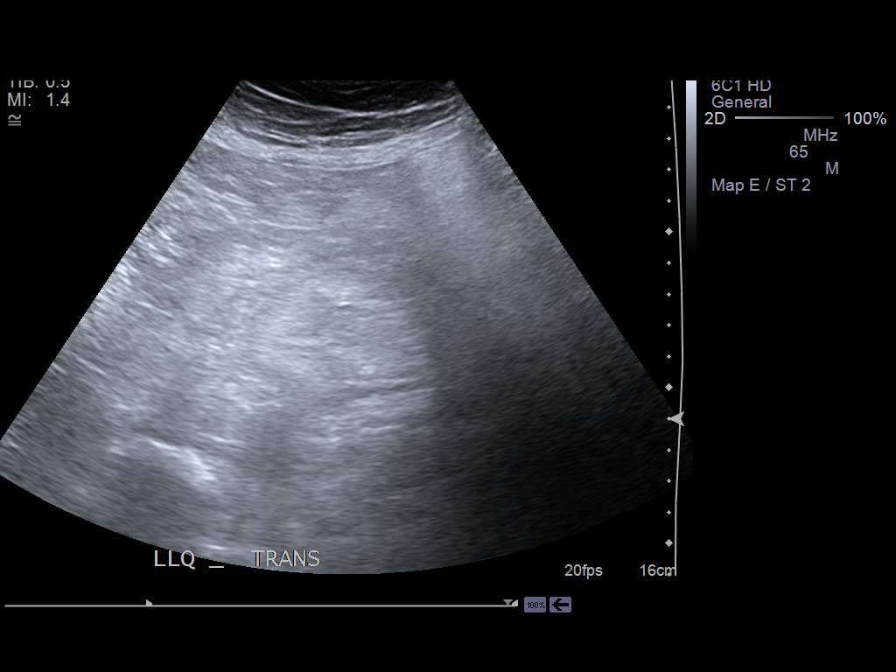
[im 8/8]
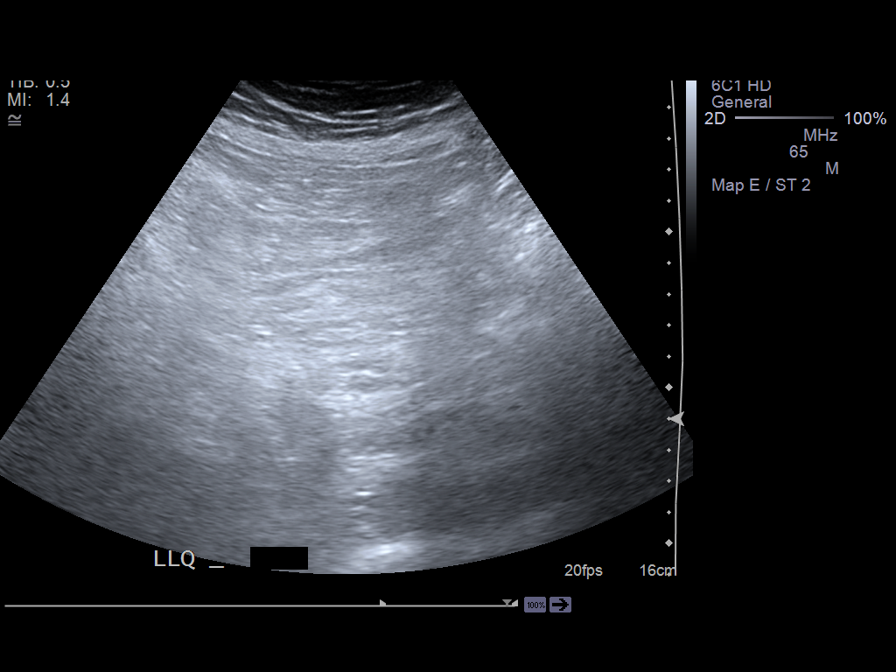

[8 of 8 positions shown; findings below may reference images not displayed]

IMPRESSION: No discrete drainable fluid collections are demonstrated
within the abdomen.

[REDACTED]

## 2013-01-17 IMAGING — CR DG CHEST 1V PORT
1 series · 1 of 1 positions shown · non-contrast
Comparison: none

REASON FOR EXAM: possible aspiration
COMMENTS:

[ap]
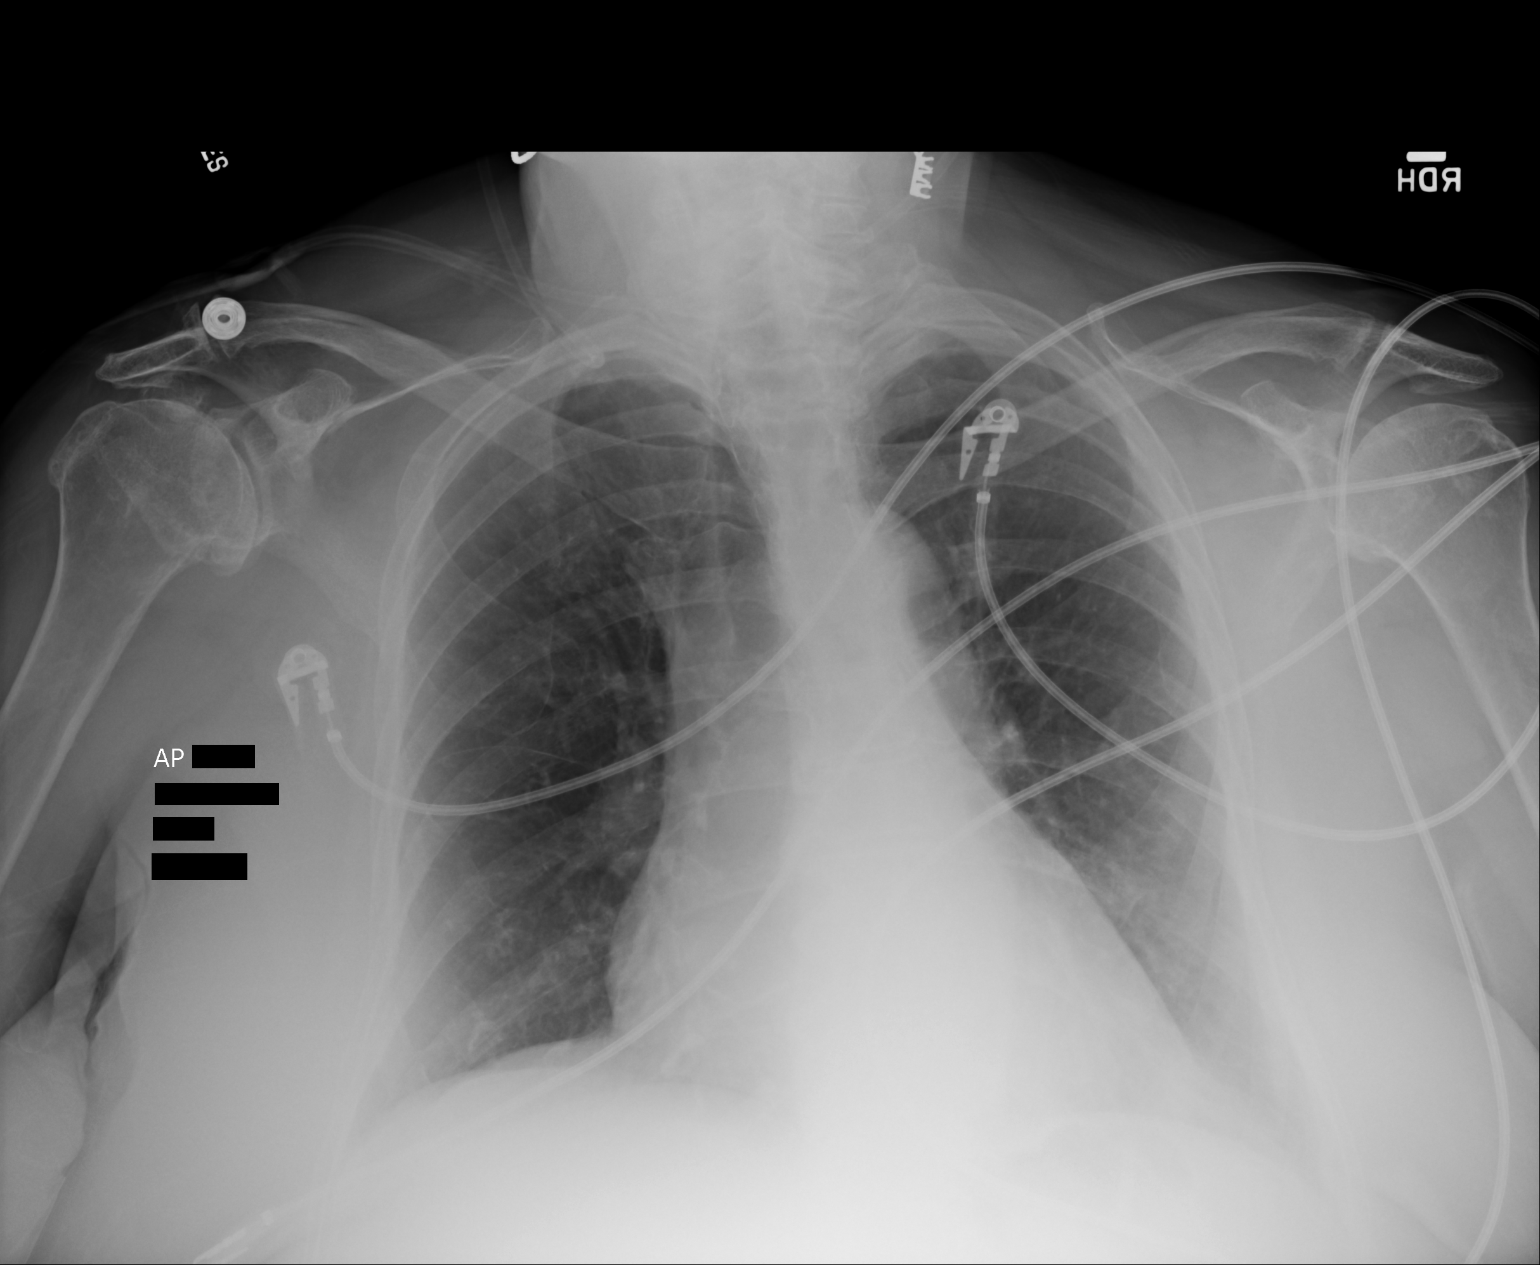

[1 of 1 positions shown; findings below may reference images not displayed]

PROCEDURE:     DXR - DXR PORTABLE CHEST SINGLE VIEW  - June 26, 2012  [DATE]

RESULT:     Comparison is made to the study June 24, 2012.

The lungs are well-expanded. There is no focal infiltrate or evidence of
aspiration. The cardiac silhouette is normal in size. The pulmonary
vascularity is not engorged.
IMPRESSION: There are no findings to suggest aspiration.

[REDACTED]

## 2013-02-24 ENCOUNTER — Ambulatory Visit: Payer: Self-pay | Admitting: Radiation Oncology

## 2013-03-08 ENCOUNTER — Ambulatory Visit: Payer: Self-pay | Admitting: Radiation Oncology

## 2013-04-07 ENCOUNTER — Ambulatory Visit: Payer: Self-pay | Admitting: Radiation Oncology

## 2013-04-26 ENCOUNTER — Ambulatory Visit: Payer: Self-pay | Admitting: General Surgery

## 2013-04-28 ENCOUNTER — Encounter: Payer: Self-pay | Admitting: General Surgery

## 2013-05-03 ENCOUNTER — Ambulatory Visit: Payer: Self-pay | Admitting: General Surgery

## 2013-05-10 ENCOUNTER — Ambulatory Visit (INDEPENDENT_AMBULATORY_CARE_PROVIDER_SITE_OTHER): Payer: Medicare Other | Admitting: General Surgery

## 2013-05-10 ENCOUNTER — Encounter: Payer: Self-pay | Admitting: General Surgery

## 2013-05-10 VITALS — BP 170/72 | HR 86 | Resp 18 | Ht 62.0 in | Wt 174.0 lb

## 2013-05-10 DIAGNOSIS — Z853 Personal history of malignant neoplasm of breast: Secondary | ICD-10-CM | POA: Insufficient documentation

## 2013-05-10 NOTE — Progress Notes (Signed)
Patient ID: Whitney Woods, female   DOB: May 25, 1925, 79 y.o.   MRN: [id number]  Chief Complaint  Patient presents with  . Follow-up    mammogram    HPI Whitney Woods is a 79 y.o. female who presents for her annual breast evaluation and follow up mammogram. The most recent mammogram was done on 04-26-13.  Patient does perform regular self breast checks and gets regular mammograms done.  No new breast issues.   HPI  Past Medical History  Diagnosis Date  . Arthritis 10 years  . Hypertension 10 years  . Heart murmur 10 years  . Thyroid disease   . Malignant neoplasm of lower-inner quadrant of female breast 2012  . Anemia   . Dizziness   . CHF (congestive heart failure)     Dr Gwen Pounds    Past Surgical History  Procedure Laterality Date  . Upper gi endoscopy  2013  . Colonoscopy  2013    Whitney Woods  . Abdominal hysterectomy  age 69     partial  . Appendectomy  age 14  . Breast lumpectomy Left 2012    with radiation    Family History  Problem Relation Age of Onset  . Breast cancer Maternal Grandmother 66    Social History History  Substance Use Topics  . Smoking status: Never Smoker   . Smokeless tobacco: Never Used  . Alcohol Use: No    No Known Allergies  Current Outpatient Prescriptions  Medication Sig Dispense Refill  . hydrochlorothiazide (HYDRODIURIL) 12.5 MG tablet Take 1 tablet by mouth daily.      Marland Kitchen levothyroxine (SYNTHROID, LEVOTHROID) 88 MCG tablet Take 1 tablet by mouth daily.      . pantoprazole (PROTONIX) 40 MG tablet Take 1 tablet by mouth daily.      Marland Kitchen tolterodine (DETROL) 1 MG tablet Take 1 tablet by mouth daily.       No current facility-administered medications for this visit.    Review of Systems Review of Systems  Constitutional: Negative.   Respiratory: Positive for shortness of breath (chronic).   Cardiovascular: Negative.   Neurological: Positive for dizziness (Followed by Dr Jenne Campus).    Blood pressure 170/72, pulse 86,  resp. rate 18, height 5\' 2"  (1.575 m), weight 174 lb (78.926 kg).  Physical Exam Physical Exam  Constitutional: She is oriented to person, place, and time. She appears well-developed and well-nourished.  Cardiovascular: Normal rate and regular rhythm.   Pulmonary/Chest: Effort normal and breath sounds normal. Right breast exhibits no inverted nipple, no mass, no nipple discharge, no skin change and no tenderness.  Volume loss left breast 6 o'clock position  Lymphadenopathy:    She has no cervical adenopathy.    She has no axillary adenopathy.  Neurological: She is alert and oriented to person, place, and time.  Skin: Skin is warm and dry.    Data Reviewed Bilateral mammograms dated 04/26/2013 showed no interval change. BI-RAD-2  Assessment    Stable breast exam. ER/PR-negative tumor.    Plan    We'll plan for followup examination with bilateral mammograms in one year.       Whitney Woods 05/12/2013, 7:27 AM

## 2013-05-10 NOTE — Patient Instructions (Addendum)
Continue self breast exams. Call office for any new breast issues or concerns. One year bilateral diagnostic mammogram and office visit

## 2013-05-12 ENCOUNTER — Encounter: Payer: Self-pay | Admitting: General Surgery

## 2013-05-29 ENCOUNTER — Encounter: Payer: Self-pay | Admitting: General Surgery

## 2013-08-10 ENCOUNTER — Ambulatory Visit: Payer: Self-pay | Admitting: Family Medicine

## 2013-12-12 ENCOUNTER — Encounter: Payer: Self-pay | Admitting: Neurology

## 2014-01-05 ENCOUNTER — Encounter: Payer: Self-pay | Admitting: Neurology

## 2014-02-05 ENCOUNTER — Encounter: Payer: Self-pay | Admitting: Neurology

## 2014-02-18 ENCOUNTER — Observation Stay: Payer: Self-pay | Admitting: Internal Medicine

## 2014-02-18 LAB — CBC
HCT: 40.2 % (ref 35.0–47.0)
HGB: 13.1 g/dL (ref 12.0–16.0)
MCH: 28.7 pg (ref 26.0–34.0)
MCHC: 32.6 g/dL (ref 32.0–36.0)
MCV: 88 fL (ref 80–100)
Platelet: 255 10*3/uL (ref 150–440)
RBC: 4.57 10*6/uL (ref 3.80–5.20)
RDW: 15 % — ABNORMAL HIGH (ref 11.5–14.5)
WBC: 8.9 10*3/uL (ref 3.6–11.0)

## 2014-02-18 LAB — CK TOTAL AND CKMB (NOT AT ARMC)
CK, Total: 154 U/L
CK-MB: 3.6 ng/mL (ref 0.5–3.6)

## 2014-02-18 LAB — BASIC METABOLIC PANEL
Anion Gap: 8 (ref 7–16)
BUN: 90 mg/dL — ABNORMAL HIGH (ref 7–18)
CREATININE: 2.62 mg/dL — AB (ref 0.60–1.30)
Calcium, Total: 9.9 mg/dL (ref 8.5–10.1)
Chloride: 106 mmol/L (ref 98–107)
Co2: 23 mmol/L (ref 21–32)
EGFR (Non-African Amer.): 16 — ABNORMAL LOW
GFR CALC AF AMER: 18 — AB
GLUCOSE: 146 mg/dL — AB (ref 65–99)
Osmolality: 304 (ref 275–301)
Potassium: 4.7 mmol/L (ref 3.5–5.1)
Sodium: 137 mmol/L (ref 136–145)

## 2014-02-18 LAB — PRO B NATRIURETIC PEPTIDE: B-Type Natriuretic Peptide: 76 pg/mL (ref 0–450)

## 2014-02-18 LAB — TROPONIN I

## 2014-02-19 LAB — CBC WITH DIFFERENTIAL/PLATELET
BASOS ABS: 0.1 10*3/uL (ref 0.0–0.1)
Basophil %: 0.7 %
EOS ABS: 0 10*3/uL (ref 0.0–0.7)
Eosinophil %: 0.5 %
HCT: 38.7 % (ref 35.0–47.0)
HGB: 12.2 g/dL (ref 12.0–16.0)
Lymphocyte #: 1.5 10*3/uL (ref 1.0–3.6)
Lymphocyte %: 16.1 %
MCH: 28.5 pg (ref 26.0–34.0)
MCHC: 31.7 g/dL — AB (ref 32.0–36.0)
MCV: 90 fL (ref 80–100)
MONO ABS: 0.9 x10 3/mm (ref 0.2–0.9)
Monocyte %: 10 %
NEUTROS ABS: 6.8 10*3/uL — AB (ref 1.4–6.5)
NEUTROS PCT: 72.7 %
PLATELETS: 217 10*3/uL (ref 150–440)
RBC: 4.3 10*6/uL (ref 3.80–5.20)
RDW: 15.1 % — ABNORMAL HIGH (ref 11.5–14.5)
WBC: 9.3 10*3/uL (ref 3.6–11.0)

## 2014-02-19 LAB — BASIC METABOLIC PANEL
Anion Gap: 7 (ref 7–16)
BUN: 78 mg/dL — ABNORMAL HIGH (ref 7–18)
CO2: 25 mmol/L (ref 21–32)
Calcium, Total: 9.4 mg/dL (ref 8.5–10.1)
Chloride: 108 mmol/L — ABNORMAL HIGH (ref 98–107)
Creatinine: 2.41 mg/dL — ABNORMAL HIGH (ref 0.60–1.30)
EGFR (African American): 20 — ABNORMAL LOW
EGFR (Non-African Amer.): 17 — ABNORMAL LOW
Glucose: 130 mg/dL — ABNORMAL HIGH (ref 65–99)
Osmolality: 304 (ref 275–301)
POTASSIUM: 4.9 mmol/L (ref 3.5–5.1)
Sodium: 140 mmol/L (ref 136–145)

## 2014-02-19 LAB — TROPONIN I: Troponin-I: <0.02 ng/mL

## 2014-02-19 LAB — CK TOTAL AND CKMB (NOT AT ARMC)
CK, Total: 153 U/L
CK-MB: 3.1 ng/mL (ref 0.5–3.6)

## 2014-02-22 ENCOUNTER — Inpatient Hospital Stay: Payer: Self-pay | Admitting: Specialist

## 2014-02-22 LAB — URINALYSIS, COMPLETE
BILIRUBIN, UR: NEGATIVE
BLOOD: NEGATIVE
Glucose,UR: NEGATIVE mg/dL (ref 0–75)
Hyaline Cast: 2
KETONE: NEGATIVE
Nitrite: NEGATIVE
Ph: 5 (ref 4.5–8.0)
Protein: NEGATIVE
RBC,UR: 5 /HPF (ref 0–5)
Specific Gravity: 1.019 (ref 1.003–1.030)
Squamous Epithelial: 16
WBC UR: 35 /HPF (ref 0–5)

## 2014-02-22 LAB — COMPREHENSIVE METABOLIC PANEL
ALK PHOS: 102 U/L
ALT: 172 U/L — AB (ref 12–78)
ANION GAP: 10 (ref 7–16)
AST: 81 U/L — AB (ref 15–37)
Albumin: 3 g/dL — ABNORMAL LOW (ref 3.4–5.0)
BUN: 58 mg/dL — AB (ref 7–18)
Bilirubin,Total: 0.9 mg/dL (ref 0.2–1.0)
CALCIUM: 9.2 mg/dL (ref 8.5–10.1)
CHLORIDE: 100 mmol/L (ref 98–107)
CO2: 22 mmol/L (ref 21–32)
Creatinine: 2.71 mg/dL — ABNORMAL HIGH (ref 0.60–1.30)
EGFR (African American): 18 — ABNORMAL LOW
EGFR (Non-African Amer.): 15 — ABNORMAL LOW
GLUCOSE: 124 mg/dL — AB (ref 65–99)
Osmolality: 282 (ref 275–301)
Potassium: 4.3 mmol/L (ref 3.5–5.1)
SODIUM: 132 mmol/L — AB (ref 136–145)
Total Protein: 7.1 g/dL (ref 6.4–8.2)

## 2014-02-22 LAB — CBC
HCT: 34.3 % — ABNORMAL LOW (ref 35.0–47.0)
HGB: 11.4 g/dL — ABNORMAL LOW (ref 12.0–16.0)
MCH: 29.3 pg (ref 26.0–34.0)
MCHC: 33.3 g/dL (ref 32.0–36.0)
MCV: 88 fL (ref 80–100)
Platelet: 197 10*3/uL (ref 150–440)
RBC: 3.9 10*6/uL (ref 3.80–5.20)
RDW: 15.3 % — AB (ref 11.5–14.5)
WBC: 9.4 10*3/uL (ref 3.6–11.0)

## 2014-02-22 LAB — LIPASE, BLOOD: Lipase: 98 U/L (ref 73–393)

## 2014-02-23 LAB — COMPREHENSIVE METABOLIC PANEL
ALBUMIN: 2.5 g/dL — AB (ref 3.4–5.0)
ANION GAP: 7 (ref 7–16)
AST: 137 U/L — AB (ref 15–37)
Alkaline Phosphatase: 139 U/L — ABNORMAL HIGH
BUN: 35 mg/dL — AB (ref 7–18)
Bilirubin,Total: 0.6 mg/dL (ref 0.2–1.0)
CREATININE: 1.6 mg/dL — AB (ref 0.60–1.30)
Calcium, Total: 8.6 mg/dL (ref 8.5–10.1)
Chloride: 108 mmol/L — ABNORMAL HIGH (ref 98–107)
Co2: 22 mmol/L (ref 21–32)
EGFR (African American): 33 — ABNORMAL LOW
EGFR (Non-African Amer.): 29 — ABNORMAL LOW
Glucose: 78 mg/dL (ref 65–99)
OSMOLALITY: 281 (ref 275–301)
POTASSIUM: 4 mmol/L (ref 3.5–5.1)
SGPT (ALT): 214 U/L — ABNORMAL HIGH (ref 12–78)
SODIUM: 137 mmol/L (ref 136–145)
Total Protein: 6.4 g/dL (ref 6.4–8.2)

## 2014-02-23 LAB — CBC WITH DIFFERENTIAL/PLATELET
BASOS PCT: 0.4 %
Basophil #: 0 10*3/uL (ref 0.0–0.1)
EOS ABS: 0.2 10*3/uL (ref 0.0–0.7)
Eosinophil %: 3.7 %
HCT: 31.4 % — ABNORMAL LOW (ref 35.0–47.0)
HGB: 10.4 g/dL — ABNORMAL LOW (ref 12.0–16.0)
LYMPHS ABS: 1 10*3/uL (ref 1.0–3.6)
LYMPHS PCT: 18.3 %
MCH: 29.5 pg (ref 26.0–34.0)
MCHC: 33.1 g/dL (ref 32.0–36.0)
MCV: 89 fL (ref 80–100)
Monocyte #: 0.9 x10 3/mm (ref 0.2–0.9)
Monocyte %: 16.8 %
NEUTROS PCT: 60.8 %
Neutrophil #: 3.3 10*3/uL (ref 1.4–6.5)
Platelet: 179 10*3/uL (ref 150–440)
RBC: 3.52 10*6/uL — AB (ref 3.80–5.20)
RDW: 15.1 % — AB (ref 11.5–14.5)
WBC: 5.5 10*3/uL (ref 3.6–11.0)

## 2014-02-23 LAB — CLOSTRIDIUM DIFFICILE(ARMC)

## 2014-02-24 LAB — COMPREHENSIVE METABOLIC PANEL
Albumin: 2.3 g/dL — ABNORMAL LOW (ref 3.4–5.0)
Alkaline Phosphatase: 142 U/L — ABNORMAL HIGH
Anion Gap: 8 (ref 7–16)
BUN: 16 mg/dL (ref 7–18)
Bilirubin,Total: 0.6 mg/dL (ref 0.2–1.0)
CREATININE: 1.01 mg/dL (ref 0.60–1.30)
Calcium, Total: 8.2 mg/dL — ABNORMAL LOW (ref 8.5–10.1)
Chloride: 109 mmol/L — ABNORMAL HIGH (ref 98–107)
Co2: 22 mmol/L (ref 21–32)
EGFR (African American): 58 — ABNORMAL LOW
EGFR (Non-African Amer.): 50 — ABNORMAL LOW
Glucose: 90 mg/dL (ref 65–99)
Osmolality: 278 (ref 275–301)
Potassium: 4 mmol/L (ref 3.5–5.1)
SGOT(AST): 90 U/L — ABNORMAL HIGH (ref 15–37)
SGPT (ALT): 202 U/L — ABNORMAL HIGH (ref 12–78)
Sodium: 139 mmol/L (ref 136–145)
Total Protein: 5.1 g/dL — ABNORMAL LOW (ref 6.4–8.2)

## 2014-02-24 LAB — URINE CULTURE

## 2014-02-25 LAB — HEPATIC FUNCTION PANEL A (ARMC)
ALBUMIN: 2.6 g/dL — AB (ref 3.4–5.0)
ALK PHOS: 142 U/L — AB
BILIRUBIN TOTAL: 0.6 mg/dL (ref 0.2–1.0)
Bilirubin, Direct: 0.1 mg/dL (ref 0.00–0.20)
SGOT(AST): 51 U/L — ABNORMAL HIGH (ref 15–37)
SGPT (ALT): 158 U/L — ABNORMAL HIGH (ref 12–78)
TOTAL PROTEIN: 6.6 g/dL (ref 6.4–8.2)

## 2014-02-25 LAB — LIPASE, BLOOD: LIPASE: 76 U/L (ref 73–393)

## 2014-02-26 DIAGNOSIS — K8019 Calculus of gallbladder with other cholecystitis with obstruction: Secondary | ICD-10-CM

## 2014-02-26 LAB — COMPREHENSIVE METABOLIC PANEL
ALT: 125 U/L — AB (ref 12–78)
ANION GAP: 10 (ref 7–16)
AST: 43 U/L — AB (ref 15–37)
Albumin: 2.4 g/dL — ABNORMAL LOW (ref 3.4–5.0)
Alkaline Phosphatase: 121 U/L — ABNORMAL HIGH
BILIRUBIN TOTAL: 0.5 mg/dL (ref 0.2–1.0)
BUN: 11 mg/dL (ref 7–18)
CHLORIDE: 107 mmol/L (ref 98–107)
CREATININE: 0.98 mg/dL (ref 0.60–1.30)
Calcium, Total: 8.9 mg/dL (ref 8.5–10.1)
Co2: 23 mmol/L (ref 21–32)
EGFR (Non-African Amer.): 52 — ABNORMAL LOW
Glucose: 99 mg/dL (ref 65–99)
OSMOLALITY: 279 (ref 275–301)
Potassium: 3.5 mmol/L (ref 3.5–5.1)
SODIUM: 140 mmol/L (ref 136–145)
Total Protein: 6.5 g/dL (ref 6.4–8.2)

## 2014-02-26 LAB — HEMOGLOBIN: HGB: 10.8 g/dL — AB (ref 12.0–16.0)

## 2014-03-04 HISTORY — PX: OTHER SURGICAL HISTORY: SHX169

## 2014-03-06 ENCOUNTER — Ambulatory Visit (INDEPENDENT_AMBULATORY_CARE_PROVIDER_SITE_OTHER): Payer: Medicare Other | Admitting: General Surgery

## 2014-03-06 ENCOUNTER — Encounter: Payer: Self-pay | Admitting: General Surgery

## 2014-03-06 VITALS — BP 118/62 | HR 70 | Resp 14 | Ht 62.0 in | Wt 170.0 lb

## 2014-03-06 DIAGNOSIS — K8011 Calculus of gallbladder with chronic cholecystitis with obstruction: Secondary | ICD-10-CM

## 2014-03-06 DIAGNOSIS — K8010 Calculus of gallbladder with chronic cholecystitis without obstruction: Secondary | ICD-10-CM | POA: Insufficient documentation

## 2014-03-06 DIAGNOSIS — K8050 Calculus of bile duct without cholangitis or cholecystitis without obstruction: Secondary | ICD-10-CM

## 2014-03-06 DIAGNOSIS — K8001 Calculus of gallbladder with acute cholecystitis with obstruction: Secondary | ICD-10-CM

## 2014-03-06 NOTE — Progress Notes (Signed)
Patient ID: Whitney Woods, female   DOB: [birth year], 80 y.o.   MRN: [id number]  Chief Complaint  Patient presents with  . Routine Post Op    gallstones    HPI Whitney Woods is a 80 y.o. female here today to discuss gallbladder surgery. The patient was admitted to the hospital June 18-22, 2015 for abdominal pain with the identification of choledocholithiasis. She underwent ERCP with stone extraction. During her hospitalization she had a formal consultation with Dr. Jamal Collin, and an informal consultation with myself. At that time she was desirous of a time at home prior to making a final decision about elective cholecystectomy.  The patient presents today to discuss elective cholecystectomy. She has had no recurrent episodes of pain.  HPI  Past Medical History  Diagnosis Date  . Arthritis 10 years  . Hypertension 10 years  . Heart murmur 10 years  . Thyroid disease   . Anemia   . Dizziness   . CHF (congestive heart failure)     Dr Nehemiah Massed  . Malignant neoplasm of lower-inner quadrant of female breast 2012    Invasive lobular carcinoma of the left breast, LCIS. 2.2 cm. Negative sentinel node. ER/PR negative., HER-2/neu nonamplified.    Past Surgical History  Procedure Laterality Date  . Upper gi endoscopy  2013  . Colonoscopy  2013    [name]  . Abdominal hysterectomy  age 35     partial  . Appendectomy  age 81  . Breast lumpectomy Left 2012    with radiation  . Gallstones removed  03/04/14  . Eye surgery Bilateral   . Cosmetic surgery      Family History  Problem Relation Age of Onset  . Breast cancer Maternal Grandmother 70    Social History History  Substance Use Topics  . Smoking status: Never Smoker   . Smokeless tobacco: Never Used  . Alcohol Use: No    No Known Allergies  Current Outpatient Prescriptions  Medication Sig Dispense Refill  . aspirin 81 MG tablet Take 81 mg by mouth daily.      Marland Kitchen atenolol (TENORMIN) 25 MG tablet Take 25 mg by mouth  daily.      . Cholecalciferol (VITAMIN D-3) 1000 UNITS CAPS Take 1 capsule by mouth 3 (three) times daily.      . ciprofloxacin (CIPRO) 250 MG tablet Take 250 mg by mouth 3 (three) times a week.      . clobetasol cream (TEMOVATE) 0.81 % Apply 1 application topically 2 (two) times daily.      . ferrous sulfate 325 (65 FE) MG tablet Take 325 mg by mouth daily with breakfast.      . levothyroxine (SYNTHROID, LEVOTHROID) 75 MCG tablet Take 75 mcg by mouth daily before breakfast.      . loperamide (IMODIUM) 2 MG capsule Take 2 mg by mouth as needed for diarrhea or loose stools.      . metoprolol succinate (TOPROL-XL) 100 MG 24 hr tablet Take 100 mg by mouth daily. Take with or immediately following a meal.      . omeprazole (PRILOSEC) 40 MG capsule Take 40 mg by mouth daily.      Marland Kitchen oxybutynin (DITROPAN-XL) 10 MG 24 hr tablet Take 10 mg by mouth at bedtime.      . simvastatin (ZOCOR) 20 MG tablet Take 20 mg by mouth daily.      . vitamin B-12 (CYANOCOBALAMIN) 1000 MCG tablet Take 1,000 mcg by mouth daily.  No current facility-administered medications for this visit.    Review of Systems Review of Systems  Constitutional: Negative.   Respiratory: Negative.   Cardiovascular: Negative.   Gastrointestinal: Negative.     Blood pressure 118/62, pulse 70, resp. rate 14, height 5' 2"  (1.575 m), weight 170 lb (77.111 kg).  Physical Exam Physical Exam  Constitutional: She is oriented to person, place, and time. She appears well-developed and well-nourished.  Eyes: Conjunctivae are normal. No scleral icterus.  Neck: Neck supple.  Cardiovascular: Normal rate, regular rhythm and normal heart sounds.   Pulmonary/Chest: Effort normal and breath sounds normal.  Abdominal: Soft. Normal appearance and bowel sounds are normal.  Neurological: She is alert and oriented to person, place, and time.  Skin: Skin is warm and dry.    Data Reviewed  During her hospitalization she had a suspicion for  urinary tract infection, cultured showed only 60,000 group G. strep. This was treated with Cipro.  CT scan obtained in the emergency department for abdominal pain dated 02/22/2014 was reviewed. Abnormal distention of the gallbladder was identified without cholelithiasis appreciated. (5 x 11 cm). A distinct pancreatic mass was not identified but the pancreatic head was somewhat irregular.  Abdominal ultrasound dated 02/22/2014 showed a common bile duct at 13.8 mm, correlating with a 15 mm common bile duct on CT. Multiple gallbladder sludge without shadowing gallstones. Gallbladder wall thickness 3.4 mm. No pericholecystic fluid.  MRCP dated 02/22/2014 showed a 1 cm common bile duct stone.   Laboratory studies obtained throughout her hospitalization showed mild anemia. No elevation of the bilirubin. Scant elevation of the serum transaminases post ERCP.  ERCP reported multiple stones removed.    Assessment     Reason episode of choledocholithiasis with biliary dilatation.  No clearly identified gallstones on multimodal imaging of the gallbladder.     Plan     the gallbladder ultrasound showed sludge, no stones. No additional findings on CT or MR CP.  She likely has stones, obscured due to to the dilatation of the gallbladder. Prior to surgical intervention, a repeat abdominal ultrasound will be obtained to confirm that cholecystectomy will be of benefit.  The risks associated with elective cholecystectomy including those of bleeding, infection, biliary tract injury and the possibility of an open procedure were reviewed with the patient and her family. She is at high risk due to her age and multiple medical comorbidities, but if ultrasound is confirmed stones the risk will be offset by minimizing the chance for recurrent choledocholithiasis.  He images obtained during her ERCP will be at retrieved for review. If reflux in the gallbladder does indeed show stones preop ultrasound would not be  required.    Patient's surgery has been scheduled for 03-16-14 at [facility].    Robert Bellow 03/06/2014, 8:03 PM

## 2014-03-06 NOTE — Patient Instructions (Addendum)
Cholecystostomy  The gallbladder is a pear-shaped organ that lies beneath the liver on the right side of the body. The gallbladder stores bile, a fluid that helps the body digest fats. However, sometimes bile and other fluids build up in the gallbladder because of an obstruction (for example, a gallstones). This can cause fever, pain, swelling, nausea and other serious symptoms. The procedure used to drain these fluids is called a cholecystostomy. A tube is inserted into the gallbladder. Fluid drains through the tube into a plastic bag outside the body. This procedure is usually done on people who are admitted to the hospital. The procedure is often recommended for people who cannot have gallbladder surgery right away, usually because they are too ill to make it through surgery. The cholecystostomy tube is usually temporary, until surgery can be done. RISKS AND COMPLICATIONS Although rare, complications can include:  Clogging of the tube.  Infection in or around the drain site. Antibiotics might be prescribed for the infection. Or, another tube might be inserted to drain the infected fluid.  Internal bleeding from the liver. BEFORE THE PROCEDURE   Try to quit smoking several weeks before the procedure. Smoking can slow healing.  Arrange for someone to drive you home from the hospital.  Right before your procedure, avoid all foods and liquids after midnight. This includes coffee, tea and water.  On the day of the procedure, arrive early to fill out all the paperwork. PROCEDURE You will be given a sedative to make you sleepy and a local anesthetic to numb the skin. Next, a small cut is made in the abdomen. Then a tube is threaded through the cut into the gallbladder. The procedure is usually done with ultrasound to guide the tube into the gallbladder. Once the tube is in place, the drain is secured to the skin with a stitch. The tube is then connected to a drainage bag.  AFTER THE PROCEDURE    People who have a cholecystostomy usually stay in the hospital for several days because they are so ill. You might not be able to eat for the first few days. Instead, you will be connected to an IV for fluids and nutrients.  The procedure does not cure the blockage that caused the fluid to build up in the first place. Because of this, the gallbladder will need to be removed in the future. The drain is removed at that time. HOME CARE INSTRUCTIONS  Be sure to follow your healthcare Husna Krone's instructions carefully. You may shower but avoid tub baths and swimming until your caregiver says it is OK. Eat and drink according to the directions you have been given. And be sure to make all follow-up appointments.  Call your healthcare Nani Ingram if you notice new pain, redness or swelling around the wound. SEEK IMMEDIATE MEDICAL CARE IF:   There is increased abdominal pain.  Nausea or vomiting occurs.  You develop a fever.  The drainage tube comes out of the abdomen. Document Released: 11/20/2008 Document Revised: 11/16/2011 Document Reviewed: 11/20/2008 [facility] Patient Information 2015 [place], [place]. This information is not intended to replace advice given to you by your health care Zakiyyah Savannah. Make sure you discuss any questions you have with your health care Leif Loflin.  Patient's surgery has been scheduled for 03-16-14 at [facility]

## 2014-03-07 ENCOUNTER — Telehealth: Payer: Self-pay | Admitting: *Deleted

## 2014-03-07 NOTE — Telephone Encounter (Signed)
Message copied by Dominga Ferry on Wed Mar 07, 2014  5:14 PM ------      Message from: [place], [place]      Created: Wed Mar 07, 2014  2:04 PM       Please arrange for an abdominal ultrasound (gallbladder) prior to preadmit date: assess for evidence of cholelithiasis.  If normal, arrange for HIDA scan to follow.  Thanks. ------

## 2014-03-07 NOTE — Telephone Encounter (Signed)
Patient has been scheduled for a limited abdominal ultrasound at [facility] for 03-08-14 at 10 am (arrive 9:45 am). If negative, patient will have a HIDA scan at 11:30 am. Prep: NPO after midnight, no smoking, no gum, and no narcotic pain medication the morning of scan. Patient aware of all instructions and verbalizes understanding.

## 2014-03-08 ENCOUNTER — Telehealth: Payer: Self-pay | Admitting: General Surgery

## 2014-03-08 ENCOUNTER — Ambulatory Visit: Payer: Self-pay | Admitting: General Surgery

## 2014-03-08 ENCOUNTER — Telehealth: Payer: Self-pay | Admitting: *Deleted

## 2014-03-08 ENCOUNTER — Encounter: Payer: Self-pay | Admitting: General Surgery

## 2014-03-08 NOTE — Telephone Encounter (Signed)
Message copied by Dominga Ferry on Thu Mar 08, 2014  3:27 PM ------      Message from: [place], [place] Gleason      Created: Thu Mar 08, 2014  2:48 PM       Cancel planned cholecystectomy (pt aware) and schedule a f/u appt for one month. Thanks.  ------

## 2014-03-08 NOTE — Telephone Encounter (Signed)
Spoke to patient while she was having her HIDA scan. (They were unable to do CCK as an IV site could not be established.  F/U ultrasound shows that gallbladder is much less distended, wall thickness 0.41 cm ( ULN) and CBD smaller at 0.9 cm ( 1.3 prior to ERCP). HIDA shows GB functional.  I think the GB wall thickness is up secondary to the marked distension prior to ERCP.  Patient is presently asymptomatic.  Spoke w/ daughter by phone.  Will put surgery on hold, and arrange for a f/u OV in one month.  The daughter was encouraged to call if any new symptoms develop in the interval.

## 2014-03-08 NOTE — Telephone Encounter (Signed)
Patient aware surgery for 03-16-14 has been cancelled. An appointment for follow up has been arranged for 04-09-14.  Leah in the O.R. has been notified of cancellation.   Message left on the scheduling line to cancel patient's pre-admit appointment for Monday, 03-12-14. Pat in pre-admit has also been notified.

## 2014-03-09 ENCOUNTER — Encounter: Payer: Self-pay | Admitting: Neurology

## 2014-03-13 ENCOUNTER — Ambulatory Visit: Payer: Self-pay | Admitting: Radiation Oncology

## 2014-04-09 ENCOUNTER — Encounter: Payer: Self-pay | Admitting: General Surgery

## 2014-04-09 ENCOUNTER — Ambulatory Visit (INDEPENDENT_AMBULATORY_CARE_PROVIDER_SITE_OTHER): Payer: Medicare Other | Admitting: General Surgery

## 2014-04-09 VITALS — BP 110/68 | HR 80 | Resp 14 | Ht 62.0 in | Wt 168.0 lb

## 2014-04-09 DIAGNOSIS — K8050 Calculus of bile duct without cholangitis or cholecystitis without obstruction: Secondary | ICD-10-CM

## 2014-04-09 NOTE — Progress Notes (Signed)
Patient ID: Whitney Woods, female   DOB: Aug 09, 1925, 80 y.o.   MRN: [id number]  Chief Complaint  Patient presents with  . Abdominal Pain    HPI Whitney Woods is a 80 y.o. female here today for her one month follow up. Patient states she is not having any more abdominal pain. She states she is doing well at this time. HPI  Past Medical History  Diagnosis Date  . Arthritis 10 years  . Hypertension 10 years  . Heart murmur 10 years  . Thyroid disease   . Anemia   . Dizziness   . CHF (congestive heart failure)     Dr Nehemiah Massed  . Malignant neoplasm of lower-inner quadrant of female breast 2012    Invasive lobular carcinoma of the left breast, LCIS. 2.2 cm. Negative sentinel node. ER/PR negative., HER-2/neu nonamplified.    Past Surgical History  Procedure Laterality Date  . Upper gi endoscopy  2013  . Colonoscopy  2013    [name]  . Abdominal hysterectomy  age 23     partial  . Appendectomy  age 30  . Breast lumpectomy Left 2012    with radiation  . Gallstones removed  03/04/14  . Eye surgery Bilateral   . Cosmetic surgery      Family History  Problem Relation Age of Onset  . Breast cancer Maternal Grandmother 70    Social History History  Substance Use Topics  . Smoking status: Never Smoker   . Smokeless tobacco: Never Used  . Alcohol Use: No    No Known Allergies  Current Outpatient Prescriptions  Medication Sig Dispense Refill  . aspirin 81 MG tablet Take 81 mg by mouth daily.      Marland Kitchen atenolol (TENORMIN) 25 MG tablet Take 25 mg by mouth daily.      . Cholecalciferol (VITAMIN D-3) 1000 UNITS CAPS Take 1 capsule by mouth 3 (three) times daily.      . ciprofloxacin (CIPRO) 250 MG tablet Take 250 mg by mouth 3 (three) times a week.      . clobetasol cream (TEMOVATE) 4.50 % Apply 1 application topically 2 (two) times daily.      . ferrous sulfate 325 (65 FE) MG tablet Take 325 mg by mouth daily with breakfast.      . levothyroxine (SYNTHROID, LEVOTHROID) 75  MCG tablet Take 75 mcg by mouth daily before breakfast.      . loperamide (IMODIUM) 2 MG capsule Take 2 mg by mouth as needed for diarrhea or loose stools.      . metoprolol succinate (TOPROL-XL) 100 MG 24 hr tablet Take 100 mg by mouth daily. Take with or immediately following a meal.      . omeprazole (PRILOSEC) 40 MG capsule Take 40 mg by mouth daily.      Marland Kitchen oxybutynin (DITROPAN-XL) 10 MG 24 hr tablet Take 10 mg by mouth at bedtime.      . simvastatin (ZOCOR) 20 MG tablet Take 20 mg by mouth daily.      . vitamin B-12 (CYANOCOBALAMIN) 1000 MCG tablet Take 1,000 mcg by mouth daily.       No current facility-administered medications for this visit.    Review of Systems Review of Systems  Constitutional: Negative.   Respiratory: Negative.   Cardiovascular: Negative.     Blood pressure 110/68, pulse 80, resp. rate 14, height 5' 2"  (1.575 m), weight 168 lb (76.204 kg).  Physical Exam Physical Exam  Constitutional: She is oriented to  person, place, and time. She appears well-developed and well-nourished.  Eyes: Conjunctivae are normal. No scleral icterus.  Neck: Neck supple.  Cardiovascular: Normal rate, regular rhythm and normal heart sounds.   Pulmonary/Chest: Effort normal and breath sounds normal.  Abdominal: Soft. Bowel sounds are normal. There is no tenderness.  Neurological: She is alert and oriented to person, place, and time.  Skin: Skin is warm and dry.    Data Reviewed Abdominal ultrasound dated 03/08/2014 had previously been reviewed. No gallstones identified. Gallbladder wall at upper limits of normal at 4.1 mm. Negative sonographic Murphy sign. Common bile duct 10 mm. (status post ERCP and stone extraction.  Assessment    Previous choledocholithiasis, no evidence of cholelithiasis or symptoms to suggest cholecystitis.    Plan    The patient remained pain-free at this time, and with no stones identified on the gallbladder ultrasound completed last month there is  presently no indication for cholecystectomy.     PCP: Arta Silence 04/10/2014, 12:12 PM

## 2014-04-09 NOTE — Patient Instructions (Signed)
Patient to return as needed. 

## 2014-04-11 ENCOUNTER — Ambulatory Visit: Payer: Self-pay | Admitting: Radiation Oncology

## 2014-05-08 ENCOUNTER — Ambulatory Visit: Payer: Self-pay | Admitting: Radiation Oncology

## 2014-05-16 ENCOUNTER — Ambulatory Visit: Payer: Self-pay | Admitting: Family Medicine

## 2014-05-28 ENCOUNTER — Ambulatory Visit (INDEPENDENT_AMBULATORY_CARE_PROVIDER_SITE_OTHER): Payer: Medicare Other | Admitting: General Surgery

## 2014-05-28 ENCOUNTER — Encounter: Payer: Self-pay | Admitting: General Surgery

## 2014-05-28 VITALS — BP 110/58 | HR 88 | Resp 14 | Ht 62.0 in | Wt 168.0 lb

## 2014-05-28 DIAGNOSIS — Z853 Personal history of malignant neoplasm of breast: Secondary | ICD-10-CM

## 2014-05-28 NOTE — Progress Notes (Signed)
Patient ID: Whitney Woods, female   DOB: 1925/04/20, 80 y.o.   MRN: [id number]  Chief Complaint  Patient presents with  . Follow-up    mammogram    HPI Whitney Woods is a 80 y.o. female who presents for a breast evaluation. The most recent mammogram was done on 05/16/14. Patient does perform regular self breast checks and gets regular mammograms done.  The patient denies any problems with the breasts at this time.   The patient complains of dizziness when standing. No problems with dizziness when sitting or laying down. She has been evaluated by ENT as well as cardiology with no source identified.As she is not experiencing any dizziness well-seated, she continues to drive.   HPI  Past Medical History  Diagnosis Date  . Arthritis 10 years  . Hypertension 10 years  . Heart murmur 10 years  . Thyroid disease   . Anemia   . Dizziness   . CHF (congestive heart failure)     Dr Nehemiah Massed  . Malignant neoplasm of lower-inner quadrant of female breast 2012    Invasive lobular carcinoma of the left breast, LCIS. 2.2 cm. Negative sentinel node. ER/PR negative., HER-2/neu nonamplified.    Past Surgical History  Procedure Laterality Date  . Upper gi endoscopy  2013  . Colonoscopy  2013    [name]  . Abdominal hysterectomy  age 54     partial  . Appendectomy  age 77  . Breast lumpectomy Left 2012    with radiation  . Gallstones removed  03/04/14  . Eye surgery Bilateral   . Cosmetic surgery      Family History  Problem Relation Age of Onset  . Breast cancer Maternal Grandmother 70    Social History History  Substance Use Topics  . Smoking status: Never Smoker   . Smokeless tobacco: Never Used  . Alcohol Use: No    No Known Allergies  Current Outpatient Prescriptions  Medication Sig Dispense Refill  . aspirin 81 MG tablet Take 81 mg by mouth daily.      Marland Kitchen atenolol (TENORMIN) 25 MG tablet Take 25 mg by mouth daily.      . Cholecalciferol (VITAMIN D-3) 1000 UNITS CAPS  Take 1 capsule by mouth 3 (three) times daily.      . clobetasol cream (TEMOVATE) 9.98 % Apply 1 application topically 2 (two) times daily.      . ferrous sulfate 325 (65 FE) MG tablet Take 325 mg by mouth daily with breakfast.      . levothyroxine (SYNTHROID, LEVOTHROID) 75 MCG tablet Take 75 mcg by mouth daily before breakfast.      . lisinopril (PRINIVIL,ZESTRIL) 20 MG tablet Take 20 mg by mouth daily.       Marland Kitchen loperamide (IMODIUM) 2 MG capsule Take 2 mg by mouth as needed for diarrhea or loose stools.      . metoprolol succinate (TOPROL-XL) 100 MG 24 hr tablet Take 100 mg by mouth daily. Take with or immediately following a meal.      . omeprazole (PRILOSEC) 40 MG capsule Take 40 mg by mouth daily.      Marland Kitchen oxybutynin (DITROPAN-XL) 10 MG 24 hr tablet Take 10 mg by mouth at bedtime.      . simvastatin (ZOCOR) 20 MG tablet Take 20 mg by mouth daily.      . vitamin B-12 (CYANOCOBALAMIN) 1000 MCG tablet Take 1,000 mcg by mouth daily.       No current facility-administered medications  for this visit.    Review of Systems Review of Systems  Constitutional: Negative.   Respiratory: Negative.   Cardiovascular: Negative.     Blood pressure 110/58, pulse 88, resp. rate 14, height _0  (1.575 m), weight 168 lb (76.204 kg).  Physical Exam Physical Exam  Constitutional: She is oriented to person, place, and time. She appears well-developed and well-nourished.  Neck: Neck supple. No thyromegaly present.  Cardiovascular: Normal rate, regular rhythm and normal heart sounds.   No murmur heard. Seeded blood pressure was 122/50 with a pulse 105, saturation 95%. Immediately on standing blood pressure remained stable at 124 were 52, pulse increase to 126. Saturation remained normal at 96%. The patient described dizziness at this time. After 3 minutes repeat blood pressure was 118/60, pulse and return to baseline at 105 and saturation remained normal at 96%. Dizziness resolved when she resumed a seated  position.  Pulmonary/Chest: Effort normal and breath sounds normal. Right breast exhibits no inverted nipple, no mass, no nipple discharge, no skin change and no tenderness. Left breast exhibits no inverted nipple, no mass, no nipple discharge, no skin change and no tenderness.  Left breast well healed incision site.   Lymphadenopathy:    She has no cervical adenopathy.    She has no axillary adenopathy.  Neurological: She is alert and oriented to person, place, and time.  Skin: Skin is warm and dry.    Data Reviewed Bilateral mammogram dated May 16, 2014 showed no interval change. BI-RAD-2.  Assessment    Doing well status post management of a stage II invasive lobular carcinoma of the left breast.  Postural dizziness of unknown etiology.       Plan    Will plan for a followup examination in one year with repeat bilateral diagnostic mammograms at that time.     PCP/ Ref. MD: Arta Silence 05/29/2014, 6:28 AM

## 2014-05-28 NOTE — Patient Instructions (Signed)
Patient to return in 1 year for follow up with bilateral mammogram. Continue self breast exams. Call office for any new breast issues or concerns.

## 2014-07-09 ENCOUNTER — Encounter: Payer: Self-pay | Admitting: General Surgery

## 2014-12-25 NOTE — Consult Note (Signed)
Chief Complaint:   Subjective/Chief Complaint No further bleeding since EGD. Received 2 units of PRBC. No abd pain.   VITAL SIGNS/ANCILLARY NOTES: **Vital Signs.:   19-Oct-13 08:00   Pulse Pulse 84   Respirations Respirations 18   Systolic BP Systolic BP 119   Diastolic BP (mmHg) Diastolic BP (mmHg) 40   Mean BP 66   Pulse Ox % Pulse Ox % 100   Pulse Ox Activity Level  At rest   Oxygen Delivery 2L; Nasal Cannula   Pulse Ox Heart Rate 84   Brief Assessment:   Cardiac Regular    Respiratory clear BS    Gastrointestinal Normal   Lab Results: Routine Chem:  19-Oct-13 05:01    Result Comment LABS - This specimen was collected through an   - indwelling catheter or arterial line.  - A minimum of 5mls of blood was wasted prior    - to collecting the sample.  Interpret  - results with caution.  Result(s) reported on 25 Jun 2012 at 05:47AM.   Glucose, Serum  119   BUN  108   Creatinine (comp)  1.75   Sodium, Serum  146   Potassium, Serum 5.1   Chloride, Serum  114   CO2, Serum 24   Calcium (Total), Serum  8.0   Anion Gap 8   Osmolality (calc) 326   eGFR (African American)  30   eGFR (Non-African American)  26 (eGFR values <60mL/min/1.73 m2 may be an indication of chronic kidney disease (CKD). Calculated eGFR is useful in patients with stable renal function. The eGFR calculation will not be reliable in acutely ill patients when serum creatinine is changing rapidly. It is not useful in  patients on dialysis. The eGFR calculation may not be applicable to patients at the low and high extremes of body sizes, pregnant women, and vegetarians.)  Routine Hem:  19-Oct-13 05:01    WBC (CBC) 8.6   RBC (CBC)  3.04   Hemoglobin (CBC)  9.3   Hematocrit (CBC)  27.1   Platelet Count (CBC)  123   MCV 89   MCH 30.5   MCHC 34.1   RDW 14.2   Neutrophil % 58.2   Lymphocyte % 26.8   Monocyte % 8.8   Eosinophil % 5.5   Basophil % 0.7   Neutrophil # 5.0   Lymphocyte # 2.3    Monocyte # 0.8   Eosinophil # 0.5   Basophil # 0.1   Assessment/Plan:  Assessment/Plan:   Assessment Acute UGI bleeding. No active bleeding now. Surprising that patient did not have gross gastric ulcers despite long term BC powder use.    Plan Continue to moniter hgb and transfuse as needed. Continue IV protonix. Ok to try clear liquid today. May consider repeating EGD on Monday if no obvious bleeding site seen. Thanks   Electronic Signatures: ,  (MD)  (Signed 19-Oct-13 08:56)  Authored: Chief Complaint, VITAL SIGNS/ANCILLARY NOTES, Brief Assessment, Lab Results, Assessment/Plan   Last Updated: 19-Oct-13 08:56 by ,  (MD) 

## 2014-12-25 NOTE — Consult Note (Signed)
Chief Complaint:   Subjective/Chief Complaint Some more bleeding yest AM, mostly old blood. Hgb dropped.   VITAL SIGNS/ANCILLARY NOTES: **Vital Signs.:   20-Oct-13 08:00   Pulse Pulse 118   Respirations Respirations 25   Systolic BP Systolic BP 951   Diastolic BP (mmHg) Diastolic BP (mmHg) 39   Mean BP 66   Pulse Ox % Pulse Ox % 94   Pulse Ox Activity Level  At rest   Oxygen Delivery 2L; Nasal Cannula   Pulse Ox Heart Rate 120   Brief Assessment:   Cardiac Regular    Respiratory clear BS    Gastrointestinal Normal   Lab Results: Routine Chem:  20-Oct-13 03:29    Result Comment CBC - SPECIMEN CLOTTED  Result(s) reported on 26 Jun 2012 at 05:01AM.   Glucose, Serum  136   BUN  113   Creatinine (comp)  1.81   Sodium, Serum 144   Potassium, Serum  5.4   Chloride, Serum  115   CO2, Serum  18   Calcium (Total), Serum  7.7   Anion Gap 11   Osmolality (calc) 325   eGFR (African American)  29   eGFR (Non-African American)  25 (eGFR values <22m/min/1.73 m2 may be an indication of chronic kidney disease (CKD). Calculated eGFR is useful in patients with stable renal function. The eGFR calculation will not be reliable in acutely ill patients when serum creatinine is changing rapidly. It is not useful in  patients on dialysis. The eGFR calculation may not be applicable to patients at the low and high extremes of body sizes, pregnant women, and vegetarians.)  Routine Hem:  20-Oct-13 03:29    WBC (CBC) -   RBC (CBC) -   Hemoglobin (CBC) -   Hematocrit (CBC) -   Platelet Count (CBC) -   MCV -   MCH -   MCHC -   RDW -   Neutrophil % -   Lymphocyte % -   Monocyte % -   Eosinophil % -   Basophil % -   Neutrophil # -   Lymphocyte # -   Monocyte # -   Eosinophil # -   Basophil # -   Bands -   Segmented Neutrophils -   Lymphocytes -   Variant Lymphocytes -   Monocytes -   Eosinophil -   Basophil -   Metamyelocyte -   Myelocyte -   Promyelocyte -   Blast-Like -    Other Cells -   NRBC -   Diff Comment 1 -   Diff Comment 2 -   Diff Comment 3 -   Diff Comment 4 -   Diff Comment 5 -   Diff Comment 6 -   Diff Comment 7 -   Diff Comment 8 -   Diff Comment 9 -   Diff Comment 10 - (Result(s) reported on 26 Jun 2012 at 05:01AM.)   Assessment/Plan:  Assessment/Plan:   Assessment UGI bleed.    Plan Keep hgb above 7 at least. Continue PPI. If more bleeding, add octreotide drip. Plan EGD tomorrow AM to visualize upper part of stomach. Keep on clears rest of today. Thanks   Electronic Signatures: OVerdie Shire(MD)  (Signed 20-Oct-13 08:51)  Authored: Chief Complaint, VITAL SIGNS/ANCILLARY NOTES, Brief Assessment, Lab Results, Assessment/Plan   Last Updated: 20-Oct-13 08:51 by OVerdie Shire(MD)

## 2014-12-25 NOTE — Discharge Summary (Signed)
PATIENT NAME:  Whitney Woods, Whitney Woods MR#:  [id number] DATE OF BIRTH:  1924/11/02  DATE OF ADMISSION:  06/24/2012 DATE OF DISCHARGE:  06/30/2012  ADDENDUM: This addendum covers the patient's hospital course on 06/30/2012. For additional details, please see the interim discharge summary done by Dr. Vaughan Basta on 06/29/2012.   DISCHARGE DIAGNOSES:  1. Upper gastrointestinal bleed.  2. Hyperlipidemia.  3. Hypothyroidism. 4. Gastroesophageal reflux disease. 5. Hypertension.  6. Hyperkalemia.  7. Acute renal failure possibly due to volume depletion and acute tubular necrosis.   DISPOSITION: The patient is being discharged home.   DISCHARGE MEDICATIONS:  1. Simvastatin 10 mg at bedtime. 2. Calcium 600 plus vitamin D 1 tablet twice a day. 3. Tolterodine 1 mg twice a day. 4. Synthroid 100 mcg daily.  5. Lopressor 50 mg twice a day. 6. Vitamin D3 1000 international units twice a day. 7. Vitamin B12 1000 mcg daily. 8. Trazodone 50 mg 1 to 2 tablets at bedtime as needed. 9. Omeprazole 40 mg daily.   DIET: Low sodium, low fat, low cholesterol, soft diet.   NOTE: The patient has been advised to discontinue taking NSAIDs.  DISPOSITION: Home with home health and physical therapy.  DISCHARGE FOLLOWUP: Followup with Dr. Candace Cruise and Dr. Astrid Divine in 1 to 2 weeks after discharge.  RESULTS: White count 6.4, hemoglobin 7.4, and platelet count 191.   HOSPITAL COURSE: The patient is an 81 year old female with past medical history of hypertension, hyperlipidemia, and hypothyroidism who presented with shortness of breath. She was found to have acute upper GI bleed. She had endoscopy initially, on 06/24/2012, which Dieulafoy lesion with  clot. Repeat EGD showed no clot but there were some gastric AVMs. The patient has received several units of blood transfusion in the hospital. Her last hemoglobin was 7.4. The patient has been advised to stop taking NSAIDs to include naproxen. By the time of discharge, she  was tolerating a soft diet. Her acute renal failure had resolved as had her tachycardia. Her hyperkalemia had also resolved. The patient was hemodynamically stable and tolerating a soft diet. She is being discharged home with home health and physical therapy in a stable condition. PT had recommended rehab but the patient diffused.   TIME SPENT: 45 minutes.  ____________________________ Cherre Huger, MD sp:slb D: 06/30/2012 13:34:24 ET T: 06/30/2012 14:18:01 ET JOB#: [id number]  cc: Cherre Huger, MD, <Dictator> Floria Raveling. Astrid Divine, MD Cherre Huger MD ELECTRONICALLY SIGNED 07/01/2012 7:20

## 2014-12-25 NOTE — Discharge Summary (Signed)
PATIENT NAME:  Whitney Woods, Whitney Woods MR#:  [id number] DATE OF BIRTH:  [birth year]  DATE OF ADMISSION:  05/24/2012 DATE OF DISCHARGE:  05/26/2012  HISTORY AND PHYSICAL: For a detailed note, please take a look at the History and Physical done on admission.   DISCHARGE DIAGNOSES:   1. Acute congestive heart failure, likely secondary to diastolic dysfunction.  2. Shortness of breath due to congestive heart failure.  3. Hypertension.  4. Hyperlipidemia.  5. Urinary incontinence.   DIET: The patient is being discharged on a low-sodium, low-fat diet.   ACTIVITY: As tolerated.   FOLLOW UP:  1. Follow up with Dr. Serafina Royals in the next one week.  2. Follow up with Dr. Gayland Curry in the next one week.   DISCHARGE MEDICATIONS:   1. Naprosyn 5 mg b.i.d.  2. Simvastatin 10 mg at bedtime.  3. Aspirin 81 mg daily.  4. Calcium with vitamin D 1 tab b.i.d.  5. Tolterodine 1 mg b.i.d.  6. Synthroid 100 mcg daily.  7. Lisinopril 20 mg b.i.d.   8. Metoprolol tartrate 50 b.i.d.  9. Omeprazole 40 mg daily.  10. Vitamin D3 1000 international units b.i.d.  11. Fosamax weekly 70 mg. 12. Vitamin B12 1000 mcg daily.  13. Trazodone 50 mg, 1 to 2 tabs at bedtime as needed.  14. Lasix 40 mg daily.  15. Potassium 20 mEq daily.   HOSPITAL CONSULTANTS:   Dr. Serafina Royals from Cardiology.   [place], DIAGNOSTIC AND RADIOLOGICAL DATA: A CT scan of the chest done with contrast on admission showed small bilateral pleural effusions. No evidence of any pulmonary emboli. A chest x-ray done on admission showed interstitial pulmonary edema. A two-dimensional echocardiogram done showed left ventricular systolic function to be normal, ejection fraction 55%. Mild-to-moderate aortic regurgitation and left atrium borderline dilated.   HOSPITAL COURSE: This is an 81 year old female who presented to the hospital secondary to shortness of breath and progressively getting worse.   1. Acute  congestive heart failure: This was likely the cause of the patient's shortness of breath. She presented with worsening lower extremity edema with some orthopnea and paroxysmal nocturnal dyspnea. The patient was started on some IV Lasix and responded well to it, and her clinical symptoms have significantly improved with that. The patient was seen by Cardiology, and they agreed with this management. The patient also underwent an echocardiogram which showed ejection fraction to be normal at 55%. Since clinically she is feeling much better now, she is being discharged on p.o. diuretics along with the maintenance of her beta blockers and ACE inhibitors and close follow-up with Cardiology as an outpatient.  2. Hypertension: The patient remained hemodynamically stable on her metoprolol and lisinopril which she will resume upon discharge.  3. Hypothyroidism: The patient was continued on Synthroid. She will resume that.  4. Urinary incontinence: The patient was maintained on Tolterodine. She will resume that.  5. Hyperlipidemia: The patient was maintained on her simvastatin. She will resume that.   6. Osteoporosis: The patient will continue her Fosamax and her vitamin D supplements as stated.  7. The patient is being discharged home with [facility] services given her new diagnosis of congestive heart failure.         TIME SPENT WITH DISCHARGE:  40 minutes.  ____________________________ Belia Heman. Verdell Carmine, MD vjs:cbb D: 05/26/2012 15:53:17 ET T: 05/27/2012 12:17:24 ET JOB#: [id number]  cc: Belia Heman. Verdell Carmine, MD, <Dictator> Corey Skains, MD Floria Raveling. Astrid Divine, MD [place]  MD ELECTRONICALLY SIGNED 06/03/2012 12:15

## 2014-12-25 NOTE — H&P (Signed)
PATIENT NAME:  Whitney Woods, Whitney Woods MR#:  [id number] DATE OF BIRTH:  [birth year]  DATE OF ADMISSION:  06/24/2012  REFERRING PHYSICIAN: Dr. Renard Hamper  PRIMARY CARE PHYSICIAN: Dr. Gayland Curry   CHIEF COMPLAINT: Dark stools and shortness of breath.  HISTORY OF PRESENT ILLNESS: Patient is a pleasant 81 year old Caucasian female with recent admission in September for acute newly diagnosed diastolic congestive heart failure likely who presents with worsening shortness of breath, dizziness and black dark tarry stools since last night. Patient denies having any bright red blood per rectum, nausea, vomiting or bright red blood vomitus. She has occasional pain in the stomach in the left quadrant. There is no fevers or chills. She has had history of EGD and colonoscopy with Dr. Charissa Bash earlier this year and was told that she has good stomach and no ulcers. She is on Naprosyn for chronic arthritis but denies having any NSAIDs and over-the-counter medications other than the aspirin she is on. Here she was found to have acute renal failure with hemoglobin of 7.9. Of note, upon last discharge time September 18, hemoglobin was 12.7. Hospitalist service was contacted for further evaluation and management.   PAST MEDICAL HISTORY:  1. Hypertension.  2. Hyperlipidemia.  3. Newly diagnosed diastolic congestive heart failure in September. 4. Hypothyroidism. 5. Urinary incontinence.  6. Osteoporosis.  7. Breast cancer, status post lumpectomy. 8. Chronic arthritis.   ALLERGIES: None.   SOCIAL HISTORY: No tobacco. Occasional alcohol. No drug use. Lives by herself.   FAMILY HISTORY: Mom with diabetes. Dad with heart attack and stroke.   OUTPATIENT MEDICATIONS: 1. Alendronate 70 mg weekly. 2. Aspirin 81 mg daily.  3. Calcium 600 + D 1 tab 2 times a day.  4. Furosemide 40 mg 1 tab daily.  5. Levothyroxine 100 mcg daily.  6. Lisinopril 20 mg 2 times a day.  7. Metoprolol titrate 50 mg 2 times a day.  8. Naprosyn 500  mg 1 tab 2 times a day.  9. Omeprazole 40 mg daily. 10. Potassium chloride 10 mEq extended-release 1 tab daily.  11. Simvastatin 10 mg at bedtime. 12. Tolterodine 1 mg two times a day.  13. Trazodone 50 mg p.r.n.  14. Vitamin B12 1000 mcg daily. 15. Vitamin D3 1000 international units, 1 cap 2 times a day.    REVIEW OF SYSTEMS: CONSTITUTIONAL: No fever, fatigue. No weakness. Some weight loss after Lasix started. EYES: No blurry vision or double vision. ENT: Positive for some worsening dizziness, otherwise some chronic hearing loss and chronic sinus drainage with occasional cough. RESPIRATORY: Occasional cough as above. No wheezing. No hemoptysis. Chronic dyspnea on exertion. No chronic obstructive pulmonary disease. CARDIOVASCULAR: No chest pain, no orthopnea, no edema. History of recently diagnosed diastolic congestive heart failure. GASTROINTESTINAL: No nausea, vomiting. Black tarry stools and diarrhea 4 to 5 times since last night. Left-sided abdominal pain which is occasional, comes and goes. No bright red blood per rectum. GENITOURINARY: No dysuria, hematuria. HEME/LYMPH: No anemia or easy bruising history. She says that she does have history of anemia in the past and at one time was on iron which caused dark stools but after she was taken off of iron her dark stools did resolve. SKIN: No new rashes. NEUROLOGICAL: Global weakness. No focal weakness. PSYCH: No anxiety or depression.   PHYSICAL EXAMINATION: VITAL SIGNS: Temperature on arrival 98.2, pulse 88, respiratory rate 24, blood pressure 117/61, oxygen sats 94% on room air.   GENERAL: Patient is an elderly Caucasian female sitting in bed in  no obvious distress, talking in full sentences.   HEENT: Normocephalic, atraumatic. Pale conjunctiva. Extraocular muscles intact. Dry mucous membranes.   NECK: Supple. No JVD. No thyroid tenderness.   CARDIOVASCULAR: S1, S2. No murmurs, rubs, or gallops.   LUNGS: Clear to auscultation without  wheezing, crackles or rhonchi.   ABDOMEN: Soft, nontender, nondistended. Some tenderness left lower quadrant palpation. Hyperactive bowel sounds all quadrants.   EXTREMITIES: No significant lower extremity edema.   NEUROLOGICAL: Cranial nerves II through XII grossly intact. Strength 5/5 in all extremities.   SKIN: No obvious rashes.   PSYCH: Awake, alert, oriented x3, pleasant and cooperative, conversant.   LABORATORY, DIAGNOSTIC, AND RADIOLOGICAL DATA: Glucose 131, BNP 187, BUN 130. Creatinine 1.92. Of note, creatinine was 1.16 on 09/19. Potassium 5.6. Magnesium 1.9, albumin 3.2, alkaline phosphatase 37, AST 7, ALT 17. Troponin negative. TSH 1.55. Hemoglobin 7.9, it was 12.7 on 09/18. WBC 12.2, hematocrit 23.7, platelets 197. INR 1. Urinalysis not suggestive of infection. Chest x-ray, one view no acute cardiopulmonary disease.   ASSESSMENT AND PLAN: We have a pleasant 81 year old Caucasian female with history of recent diagnosis for diastolic congestive heart failure, hypertension, hyperlipidemia, chronic arthritis for which she takes Naprosyn who presents with worsening shortness of breath and dizziness found with acute renal failure, hyperkalemia and acute anemia, probably upper GI bleed and will be admitted to the hospitalist service for further evaluation. Patient appears to have acute hemorrhagic anemia with a significant drop in hemoglobin and 2 units of PRBC has been ordered. She has some symptoms of worsening dizziness and shortness of breath at this point, however, has had EGD and colonoscopy earlier this year showing hiatal hernia from the EGD and some diverticulosis. At this point we would hold her NSAIDs including Naprosyn and aspirin. Would start her on PPI IV b.i.d. and get serial hemoglobins and obtain GI consult. GI has been paged currently and we are waiting for a call back. Blood pressure is stable at this point.   Patient does have acute renal failure which is probably from GI  loss, volume depletion and patient taking her medications. Would hold the lisinopril, Lasix and blood pressure medications at this point. She has been given some IV fluids here and is to have some blood products too which should help. We would obtain another BNP later tonight as patient does have significant hyperkalemia. Given her ongoing diarrhea and loose stools we would forgo on giving her Kayexalate at this point and give her insulin and D50 and some nebulizers and repeat potassium check. In regards to her diastolic congestive heart failure, this is chronic and she is not in acute diastolic failure currently. We would hold the Lasix given above. Her blood pressure is stable. Consider resuming her metoprolol once she is volume repleted. Continue her simvastatin and trazodone and Synthroid. TSH has been checked and is within normal limits.   CODE STATUS: Patient is FULL CODE.   TOTAL TIME SPENT: 50 minutes.   ____________________________ Vivien Presto, MD sa:cms D: 06/24/2012 12:12:05 ET T: 06/24/2012 12:46:05 ET JOB#: [id number]  cc: Vivien Presto, MD, <Dictator> Floria Raveling. Astrid Divine, MD Vivien Presto MD ELECTRONICALLY SIGNED 07/08/2012 12:44

## 2014-12-25 NOTE — Consult Note (Signed)
PATIENT NAME:  Whitney Woods, Whitney Woods MR#:  [id number] DATE OF BIRTH:  1924/12/21  DATE OF CONSULTATION:  05/24/2012  REFERRING PHYSICIAN:  Abel Presto, MD CONSULTING PHYSICIAN:  Isaias Cowman, MD  PRIMARY CARE PHYSICIAN: Gayland Curry, MD  CHIEF COMPLAINT: Shortness of breath.   REASON FOR CONSULTATION: Consultation requested for evaluation of congestive heart failure.   HISTORY OF PRESENT ILLNESS: The patient is an 81 year old female with history of hypertension. She reports that she was in her usual state of health until the past two weeks where she has noted some increase in shortness of breath. During the past two days, the patient became even more short of breath even at rest and was unable to lie flat. She presented to the [facility] Emergency Room where chest x-ray was performed which revealed evidence for interstitial pulmonary edema. CT scan was also performed which did not reveal evidence for pulmonary embolus. Admission laboratories were notable for troponin of less than 0.02. The patient was treated with intravenous furosemide with diuresis and clinical improvement.   PAST MEDICAL HISTORY:  1. Hypertension.  2. Hyperlipidemia.  3. Hypothyroidism.  4. Urinary incontinence.  5. Osteoporosis.   MEDICATIONS:  1. Aspirin 81 mg daily.  2. Lisinopril 20 mg daily.  3. Metoprolol tartrate 50 mg twice a day. 4. Simvastatin 10 mg at bedtime.  5. Fosamax 70 mg weekly.  6. Calcium with vitamin D one tab twice a day. 7. Synthroid 100 mcg daily.  8. Naprosyn 5 mg twice a day. 9. Omeprazole 40 mg daily. 10. Tolterodine 1 mg twice a day. 11. Trazodone 50 mg 1 to 2 tabs at bedtime p.r.n.   SOCIAL HISTORY: The patient currently lives at home by herself.   FAMILY HISTORY: Father died at age 17 status post myocardial infarction and cerebrovascular accident.  REVIEW OF SYSTEMS: CONSTITUTIONAL: No fever or chills. EYES: No blurry vision. EARS: No hearing loss. RESPIRATORY: Shortness of  breath as described above. CARDIOVASCULAR: Orthopnea as described above. GASTROINTESTINAL: No nausea, vomiting, diarrhea, or constipation. GU: No dysuria or hematuria. ENDOCRINE: No polyuria or polydipsia. INTEGUMENTARY: No rash. MUSCULOSKELETAL: The patient does have bilateral knee arthritis. NEUROLOGIC: No focal muscle weakness or numbness. PSYCHOLOGICAL: No depression or anxiety.   PHYSICAL EXAMINATION:   VITAL SIGNS: Blood pressure 163/84, pulse 100, respirations 20, temperature 97.4, and pulse oximetry 98%.   HEENT: Pupils are equal and reactive to light and accommodation.   NECK: Supple without thyromegaly.   LUNGS: Decreased breath sounds in both bases.   HEART: Normal jugular venous pressure. Normal point of maximal impulse. Regular rate and rhythm. Normal S1 and S2. No appreciable gallop, murmur, or rub.   ABDOMEN: Soft and nontender. Pulses were intact bilaterally.   MUSCULOSKELETAL: Normal muscle tone.   NEUROLOGIC: The patient is alert and oriented x3. Motor and sensory are both grossly intact.   IMPRESSION: This is an 81 year old female who presents with two week history of symptoms consistent with congestive heart failure. The patient appears to have improved clinically after diuresis. Initial troponin is negative. The patient denies chest pain and EKG is nondiagnostic.   RECOMMENDATIONS:  1. Agree with current therapy.  2. Would continue diuresis and closely monitor BUN and creatinine.  3. Review 2-D echocardiogram.  4. At this time would defer functional study such as Lexiscan sestamibi study in light of the patient's advanced age and absence of chest pain. ____________________________ Isaias Cowman, MD ap:slb D: 05/24/2012 16:42:34 ET T: 05/24/2012 17:35:59 ET JOB#: [id number]  cc: Isaias Cowman, MD, <  Dictator> Isaias Cowman MD ELECTRONICALLY SIGNED 05/29/2012 13:52

## 2014-12-25 NOTE — Consult Note (Signed)
Pt seen and examined. Please see C.London's notes. EGD performed. Large blood clot in fundus. Spent lot of time lavaging and removing clot. Fresh blood seen in fundus. Mild irritation below GE junction. However, no obvious ulcer or AVM to cauterize or clip. Dielofoy's?Keep patient NPO except meds. NG suction overnight to moniter bleeding. Check hgb closely. Will follow. THanks.  Electronic Signatures: Verdie Shire (MD)  (Signed on 18-Oct-13 16:10)  Authored  Last Updated: 18-Oct-13 16:10 by Verdie Shire (MD)

## 2014-12-25 NOTE — Consult Note (Signed)
PATIENT NAME:  Whitney Woods, Whitney Woods MR#:  [id number] DATE OF BIRTH:  11/26/1924  DATE OF CONSULTATION:  06/24/2012  REFERRING PHYSICIAN:   CONSULTING PHYSICIAN:  Whitney Demark, NP  HISTORY OF PRESENT ILLNESS: Whitney Woods is a very pleasant Caucasian woman of 39 years with history of chronic NSAID use, congestive heart failure, hypertension, hypothyroidism, breast cancer status post lumpectomy who was admitted with melena and anemia. GI has been consulted by Whitney Woods for evaluation of the same. Patient reports onset of pain between the shoulder blades, shortness of breath, dizziness, and several black stools last night. States last black stool was prior to her Emergency Department visit this morning. Reports some lower abdominal cramping. Has been taking Naprosyn twice daily for quite some time due to arthritic pain. Denies nausea, vomiting, constipation, other abdominal pain, problems swallowing, acid reflux, heartburn, indigestion. Is taking omeprazole 40 mg p.o. daily. Did note hemoglobin today of 7.9 where her prior hemoglobins were greater than 12. ED physician found black, heme positive stool. Her PT-INR is normal. CPK-MB and troponins have been normal. Her EKG today was interpreted as normal. Do note some increase in BUN, creatinine, potassium. Additionally patient reports recent EGD and colonoscopy last year, states these abnormal findings and were done by Whitney Woods.   PAST MEDICAL HISTORY:  1. Hypertension. 2. Hyperlipidemia. 3. Congestive heart failure. 4. Hypothyroidism. 5. Urinary incontinence.  6. Osteoporosis.  7. Breast cancer status post lumpectomy, had 36 radiation treatments. This was in the past, not recent.  8. Chronic arthritis. 9. Hysterectomy.  10. Appendectomy.   ALLERGIES: No known drug allergies.   SOCIAL HISTORY: Rare alcohol. No tobacco. No illicits. Lives alone.   FAMILY HISTORY: Negative for colorectal cancer, colon polyps, liver disease, ulcers. Pertinent  for diabetes, myocardial infarction, CVA, breast cancer.   MEDICATIONS:  1. Alendronate 70 mg p.o. q. week. 2. Aspirin 81 mg p.o. daily.  3. Calcium with D 1 tab b.i.d.  4. Lasix 40 mg 1 tab daily.  5. Levothyroxine 100 mcg p.o. daily.  6. Lisinopril 20 mg b.i.d.  7. Metoprolol 50 mg p.o. b.i.d.  8. Naprosyn 500, 1 tab b.i.d.  9. Omeprazole 40 mg p.o. daily.  10. Potassium chloride 10 mEq 1 daily.  11. Simvastatin 10 mg p.o. at bedtime.  12. Trazodone 50 mg at bedtime p.r.n.  13. Vitamin B12 1000 mcg p.o. daily.  14. Vitamin D3 1000 international units 1 cap b.i.d.   REVIEW OF SYSTEMS: 10 systems reviewed, unremarkable other than what is noted above in the history of present illness.  LABORATORY, DIAGNOSTIC AND RADIOLOGICAL DATA: Most recent lab work: Glucose 131, BNP 189, BUN 130, creatinine 1.92, sodium 139, potassium 5.6, chloride 106, GFR 23, calcium 8.8, magnesium 1.9, lipase 141, total serum protein 5.9, albumin 3.2, bilirubin 0.3, alkaline phosphatase 37, AST 7, ALT 17, CK 79, CPK-MB 3.3. Troponin less than 0.02. TSH 1.55. WBC 12.2, hemoglobin 7.9, hematocrit 23.7, platelet count 197. Red cells are normocytic with normal distribution. PT 13.4, INR 1.0. Chest x-ray with no acute cardiopulmonary disease. Did have echocardiogram on 09/18 with LVEF greater than 55%, mild mitral regurgitation, mild to moderate aortic regurgitation. EKG from 10/18 interpreted as normal sinus rhythm, normal EKG.   PHYSICAL EXAMINATION:  VITAL SIGNS: Most recent vital signs: Temperature 97.2, pulse 85, respiratory rate 20, blood pressure 94/64, oxygen saturation 100% on 2 liters oxygen.   GENERAL: Elderly Caucasian woman lying in bed, appears comfortable.   HEENT: Normocephalic, atraumatic. Pale conjunctiva. Mucous membranes pink,  moist. No erythema or drainage to the eyes or the nares.   NECK: Supple. No JVD, thyromegaly, lymphadenopathy.   CARDIOVASCULAR: S1, S2. No murmurs, rubs, or gallops. Regular  rate and rhythm. Peripheral pulses 2+ to all extremities. No appreciable edema.   LUNGS: Respirations eupneic. Faint wheezing noted bilaterally at the bases. Able to speak in sentences completely without distress. No increased work of breathing. Wears oxygen at 2 liters nasal cannula.   ABDOMEN: Protuberant abdomen. Bowel sounds x4, very soft. Minimal tenderness to bilateral lower quadrants. No guarding, rigidity, rebound tenderness, peritoneal signs, hepatosplenomegaly or masses.   GENITOURINARY: Deferred.   RECTAL: Deferred. This was done in the ED.   EXTREMITIES: Moves all extremities well x4. No clubbing, cyanosis, or edema.   SKIN: Pale, warm, dry. No erythema, lesion or rash.   PSYCHIATRIC: Pleasant, cooperative, good memory skills. Logical thought.   NEUROLOGICAL: Alert, oriented x3. Cranial nerves II through XII grossly intact. Speech clear. No facial droop.   IMPRESSION AND PLAN: Anemia, Heme positive black stools, chronic NSAID use concerning for upper GI bleed. Agree with scheduled hemoglobins and proton pump inhibitor therapy. Have discussed with Whitney Woods and we will plan for EGD today to assess for upper GI bleeding. I have discussed the risks and benefits of the procedure with her and she is agreeable. Additionally, anemia could cause shortness of breath, chest discomfort and dizziness. Transfusion may improve her current symptoms. Did discuss lab and patient condition with Whitney Woods. Evidently patient has had some D50 and insulin for hyperkalemia and was to receive a unit of packed red blood cells. He is going to order this now as this does not appear to have happened as of yet.   These services were provided by Whitney November, MSN, [facility] in collaboration with Whitney Woods, with whom I have discussed this patient in full.  ____________________________ Whitney Demark, NP chl:cms D: 06/24/2012 14:51:45 ET T: 06/24/2012 15:18:17 ET JOB#: [id number]  cc: Whitney Demark,  NP, <Dictator>  [place] SIGNED 06/25/2012 7:50

## 2014-12-25 NOTE — Consult Note (Signed)
Brief Consult Note: Diagnosis: UGI bleed.   Patient was seen by consultant.   Consult note dictated.   Discussed with Attending MD.   Comments: Appreciate consult for 81 y/o caucasian woman with history of chronic NSAID use, CHF, htn, hypothyroidism, breast cancer, admitted with melena and anemia. Reports onset of pain between the shoulder blades, sob, dizziness, and black stool last night. States last black stool prior to ED visit. Reports some lower abdominal cramping. Has been taking Naprosyn bid for quite some time. Denies NV, constipation, other abdominal pain, problems swallowing, acid reflux symptoms. Noted hgb today of 7.9 where prior hemoglobins were >12, and black heme positive stool found by the ED physician. Normal PT/INR. CPKMB/troponins have been normal, and EKG interpeted as normal. Do note some increase in BUN, Creatinine, and K+.   Addditionally, patient report recent EGD/colonoscopy by Dr Fleet Contras last year: states these had normal findings.  Impression and Plan: Anemia/heme positive black stools. Chronic NSAID use. Agree with scheduled hemoglobins and PPI therapy. Have discussed with Dr Candace Cruise, and we will plan for EGD today to assess upper GI bleeding. I have discussed the risks and benefits of the procedure with her. She is agreeable.  Anemia can cause sob/chest discomfort/dizziness. Transfusion may improve current symptoms.  Did discuss labs and patient condition with Dr Bridgette Habermann, evidentally patient had some D50/insulin for hyperkalemia, and was to receive a unit of PRBC. He is going to order this now..  Electronic Signatures: Stephens November H (NP)  (Signed 18-Oct-13 14:52)  Authored: Brief Consult Note   Last Updated: 18-Oct-13 14:52 by Theodore Demark (NP)

## 2014-12-25 NOTE — Consult Note (Signed)
Brief Consult Note: Diagnosis: CHF, improved after diuresis, neg trop, nondiagnostic ECG.   Patient was seen by consultant.   Consult note dictated.   Comments: REC  Agree with current therapy, cont diuresis, review echo, further rec pending echo results.  Electronic Signatures: Isaias Cowman (MD)  (Signed 17-Sep-13 16:44)  Authored: Brief Consult Note   Last Updated: 17-Sep-13 16:44 by Isaias Cowman (MD)

## 2014-12-25 NOTE — Consult Note (Signed)
Melena this AM. EGD showed no active bleeding or blood clots now. 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 These were all cauterized. Mild gastritis involving fundus noted as well. Full liquid diet ordered. Advance diet as tolerated. Stop octreotide drip. Can switch to po protonix. Moniter hgb. I will be out at [facility] tomorrow. Will check back on Wed. Thanks.  Electronic Signatures: Verdie Shire (MD)  (Signed on 21-Oct-13 11:40)  Authored  Last Updated: 21-Oct-13 11:40 by Verdie Shire (MD)

## 2014-12-25 NOTE — H&P (Signed)
PATIENT NAME:  Whitney Woods, Whitney Woods MR#:  [id number] DATE OF BIRTH:  [birth year]  DATE OF ADMISSION:  05/24/2012  PRIMARY CARE PHYSICIAN: Gayland Curry, MD   CARDIOLOGIST: Serafina Royals, MD   CHIEF COMPLAINT: Shortness of breath.   HISTORY OF PRESENT ILLNESS: This is an 81 year old female who presents to the hospital due to worsening shortness of breath ongoing for the past week to two weeks and has gotten worse in the past two days. The patient says that she has some shortness of breath on exertion, but it has gotten worse in the past two days where even at rest she is more short of breath. She says she has been unable to lie flat and sleep as she gets more short of breath. She has not really noted any lower extremity edema but does admit to some bloating in her stomach which she attributes to possible water weight. She denies any nausea or vomiting. She denies any chest pain, denies any palpitations, but does admit to dizziness but no true syncope. She denies any abdominal pain, any diarrhea or any other associated symptoms. The patient presented to the ER with these worsening symptoms and was noted to be in congestive heart failure, and Hospitalist Services were contacted for further treatment and evaluation.   REVIEW OF SYSTEMS: CONSTITUTIONAL: No documented fever. Positive weight gain. No weight loss. EYES: No blurred or double vision. ENT: No tinnitus. No postnasal drip. No redness of the oropharynx. RESPIRATORY: No cough, no wheeze, no hemoptysis. Positive dyspnea on exertion. CARDIOVASCULAR: No chest pain. Positive orthopnea. Positive paroxysmal nocturnal dyspnea. GI: No nausea, no vomiting, no diarrhea, no abdominal pain, no melena or hematochezia. GU: No dysuria, no hematuria. ENDOCRINE: No polyuria or nocturia. No heat or cold intolerance. HEMATOLOGIC:  No anemia, no bruising, no bleeding. INTEGUMENTARY: No rashes. No lesions. MUSCULOSKELETAL: No arthritis, no swelling, and no gout. NEUROLOGIC: No  numbness, no tingling, no ataxia, no seizure type activity. PSYCHIATRIC: No anxiety, no insomnia, no ADD.   PAST MEDICAL HISTORY:  1. Hypertension.  2. Hyperlipidemia.  3. Hypothyroidism.  4. Urinary incontinence.  5. Osteoporosis.   ALLERGIES: No known drug allergies.   SOCIAL HISTORY: No smoking. No alcohol abuse. No illicit drug abuse. She lives at home by herself.   FAMILY HISTORY: The patient's mother died from complications of diabetes at age 14. Father died at age 59, did have a history of myocardial infarction and cerebrovascular accident.   CURRENT MEDICATIONS:  1. Fosamax 70 mg weekly on Sundays. 2. Aspirin 81 mg daily.  3. Calcium with vitamin D 1 tab b.i.d.  4. Synthroid 100 mcg daily.  5. Lisinopril 20 mg b.i.d.  6. Metoprolol tartrate 50 mg b.i.d.  7. Naprosyn 5 mg b.i.d.  8. Omeprazole 40 mg daily.  9. Simvastatin 10 mg at bedtime.  10. Tolterodine 1 mg b.i.d.  11. Trazodone 50 mg, 1 to 2 tabs at bedtime as needed for sleep.  12. Vitamin B12 1000 mcg daily.  13. Vitamin D3 1000 international units b.i.d.   PHYSICAL EXAMINATION:  VITAL SIGNS: Temperature is 99, pulse 95, respirations 18, blood pressure 175/90, sats 96% on room air.   GENERAL: She is a pleasant-appearing female in mild respiratory distress.   HEENT: She is atraumatic, normocephalic. Her extraocular muscles are intact. Her pupils are equal and reactive to light. Her sclerae are anicteric. No conjunctival injection. No pharyngeal erythema.   NECK: Supple. No jugular venous distention, no bruits, no lymphadenopathy. No thyromegaly.   HEART: Regular rate  and rhythm. A 2 to 3/6 systolic ejection murmur,  no rubs, no clicks.   LUNGS: She has some diffuse bibasilar crackles, otherwise negative use of accessory muscles. No dullness to percussion. No wheezing, no rhonchi.   ABDOMEN: Soft, flat, nontender, nondistended. Has good bowel sounds. No hepatosplenomegaly appreciated.   EXTREMITIES: No  evidence of any cyanosis, clubbing, or peripheral edema. Has +2 pedal and radial pulses bilaterally.   NEUROLOGICAL: The patient is alert, awake, oriented x3 with no focal motor or sensory deficits appreciated bilaterally.   SKIN: Moist and warm with no rash appreciated.   LYMPHATIC: There is no cervical or axillary lymphadenopathy.    LABORATORY, DIAGNOSTIC AND RADIOLOGICAL DATA: Serum glucose 100, BNP of 2096, BUN 17, creatinine 0.9, sodium 141, potassium 4.0, chloride 109, bicarbonate 23. Troponin less than 0.02. White cell count 7.7, hemoglobin 13.1, hematocrit 39.3, platelet count 214. The patient did have a chest x-ray done which showed interstitial edema. The patient also had an EKG done which shows normal sinus rhythm with normal axis and no evidence of any acute ST or T wave changes.   ASSESSMENT AND PLAN: This is an 81 year old female with past medical history of hypertension, osteoporosis, hyperlipidemia, hypothyroidism, gastroesophageal reflux disease and urinary incontinence who presents to the hospital due to shortness of breath which is progressively getting worse.   1. Shortness of breath: This is likely suspected to be due to acute congestive heart failure given her clinical symptoms of worsening paroxysmal nocturnal dyspnea, orthopnea and weight gain. We will go ahead and gently diurese her with IV Lasix, follow ins and outs and daily weights. I do not know whether  this is systolic or diastolic heart failure. We will get an echocardiogram and also get a Cardiology consult, the patient is known to Dr. Nehemiah Massed, and we will follow her clinically. The patient apparently as an outpatient was supposed to get an exercise treadmill stress test and echocardiogram done coming up in the next few weeks. We will let Cardiology  further address this, if needed.  2. Hypertension: Presently hemodynamically stable. Continue metoprolol and lisinopril. 3. Hyperlipidemia: Continue simvastatin.   4. Hypothyroidism: Continue Synthroid.  5. Gastroesophageal reflux disease: Continue omeprazole. 6. Urinary incontinence: Continue tolterodine.   CODE STATUS: The patient is a FULL CODE.   TIME SPENT: 50 minutes.   ____________________________ Belia Heman. Verdell Carmine, MD vjs:cbb D: 05/24/2012 14:04:42 ET T: 05/24/2012 14:15:56 ET JOB#: [id number]  cc: Belia Heman. Verdell Carmine, MD, <Dictator> Floria Raveling. Astrid Divine, MD Henreitta Leber MD ELECTRONICALLY SIGNED 06/03/2012 12:15

## 2014-12-25 NOTE — Consult Note (Signed)
Chief Complaint:   Subjective/Chief Complaint Doing well. Started solids today. No further bleeding.   VITAL SIGNS/ANCILLARY NOTES: **Vital Signs.:   23-Oct-13 04:42   Vital Signs Type Routine   Temperature Temperature (F) 99.2   Celsius 37.3   Temperature Source Oral   Pulse Pulse 94   Respirations Respirations 20   Systolic BP Systolic BP 759   Diastolic BP (mmHg) Diastolic BP (mmHg) 66   Mean BP 80   Pulse Ox % Pulse Ox % 90   Pulse Ox Activity Level  At rest   Oxygen Delivery Room Air/ 21 %   Brief Assessment:   Cardiac Regular    Respiratory normal resp effort    Gastrointestinal Normal    Gastrointestinal details normal Soft   Lab Results: Routine Hem:  23-Oct-13 09:02    WBC (CBC) 7.9   RBC (CBC)  2.28   Hemoglobin (CBC)  7.1   Hematocrit (CBC)  20.7   Platelet Count (CBC) 155   MCV 91   MCH 31.1   MCHC 34.3   RDW 14.0   Neutrophil % 71.9   Lymphocyte % 13.9   Monocyte % 5.8   Eosinophil % 8.0   Basophil % 0.4   Neutrophil # 5.7   Lymphocyte # 1.1   Monocyte # 0.5   Eosinophil # 0.6   Basophil # 0.0 (Result(s) reported on 29 Jun 2012 at 09:35AM.)   Assessment/Plan:  Assessment/Plan:   Assessment 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 Stable.    Plan If no further bleeding, ok for discharge by tomorrow AM on once daily PPI. WIll sign off. Thanks   Electronic Signatures: Verdie Shire (MD)  (Signed 23-Oct-13 12:36)  Authored: Chief Complaint, VITAL SIGNS/ANCILLARY NOTES, Brief Assessment, Lab Results, Assessment/Plan   Last Updated: 23-Oct-13 12:36 by Verdie Shire (MD)

## 2014-12-29 NOTE — Consult Note (Signed)
ERCP showed multiple stones in distal CBD. These were extracted after biliary sphincterotomy and balloon sweeps. Expect pain and LFT to improve over next few days. thanks.  Electronic Signatures: Verdie Shire (MD)  (Signed on 19-Jun-15 11:03)  Authored  Last Updated: 19-Jun-15 11:03 by Verdie Shire (MD)

## 2014-12-29 NOTE — H&P (Signed)
PATIENT NAME:  Whitney Woods, Whitney Woods MR#:  [id number] DATE OF BIRTH:  [birth year]  DATE OF ADMISSION:  02/18/2014  ADMITTING PHYSICIAN: Gladstone Lighter, MD  PRIMARY CARE PHYSICIAN: Dr. Gayland Curry.   PRIMARY CARDIOLOGIST: Dr. Nehemiah Massed.   CHIEF COMPLAINT: Chest pain and epigastric pain.   HISTORY OF PRESENT ILLNESS: Whitney Woods is an 81 year old Caucasian female with past medical history significant for hypertension, diastolic CHF, hyperlipidemia, hypothyroidism, osteoporosis, significant hiatal hernia, breast cancer status post radiation presents to the hospital secondary to onset of chest pain that started this morning. She is very tender to touch in the parasternal area so her pain is mostly in the epigastric region which radiates to her back and also her whole lower abdomen, not associated with any nausea, vomiting, no diaphoresis. The patient states the pain started this morning and has been intermittent, but more consistent in the last few hours so presented to the ER. Her first set of troponin is negative. She had a CT chest done without contrast, which shows she has paraesophageal hernia, dilatation of distal esophagus suggesting of esophageal dysmotility. She is being admitted for the same.   PAST MEDICAL HISTORY:  1. Hypertension.  2. Hyperlipidemia.  3. Diastolic CHF.  4. Hypothyroidism.  5. Osteoarthritis.  6. History of breast cancer, status post lumpectomy and radiation.  7. Osteoporosis.  8. Osteoarthritis.  9. History of GI bleed requiring transfusions. This was secondary to NSAID usage in the past.  10. CKD with baseline creatinine around 1.3.   PAST SURGICAL HISTORY: Left breast lumpectomy.   ALLERGIES TO MEDICATIONS: No known drug allergies.   CURRENT HOME MEDICATIONS:  1. Atenolol 25 mg p.o. daily.  2. Clobetasol 0.5% topical cream twice a day.  3. Ferrous sulfate 325 mg p.o. daily.  4. Hydrochlorothiazide 12.5 mg p.o. daily.  5. Levothyroxine 75 mcg p.o. daily.   6. Lisinopril 20 mg p.o. daily.  7. Toprol 100 mg p.o. daily.  8. Omeprazole 40 mg p.o. daily.  9. Os-Cal 1 tablet p.o. b.i.d.  10. Oxybutynin 10 mg per 24-hour extended release tablet, 1 tablet p.o. daily.  11. Simvastatin 20 mg p.o. daily.  12. Vitamin B 12,000 mcg p.o. p.o. b.i.d.  13. Vitamin D 3000 international units p.o. b.i.d.   SOCIAL HISTORY: The patient lives at home by herself. She ambulates with a walker. No history of any smoking or alcohol use. Family history is significant for dad with stroke, mom with diabetes, and breast cancer in maternal grandmother.   REVIEW OF SYSTEMS:  CONSTITUTIONAL: No fever, fatigue, or weakness.  EYES: No blurred vision, inflammation, or glaucoma.  ENT: No tinnitus, ear pain, hearing loss, epistaxis, or discharge.  RESPIRATORY: No cough, wheeze, hemoptysis, or COPD.  CARDIOVASCULAR: Positive for chest pain. No orthopnea, edema, arrhythmia, palpitations, or syncope.  GASTROINTESTINAL: No nausea, vomiting, diarrhea. Positive for abdominal pain no hematemesis or melena. GENITOURINARY: No dysuria, hematuria, renal calculus, frequency, or incontinence ENDOCRINE: No polyuria, nocturia, thyroid problems, heat or cold intolerance.  HEMATOLOGY: No anemia, easy bruising or bleeding.  SKIN: No acne, rash or lesions.  MUSCULOSKELETAL: No neck, back, shoulder pain, arthritis, or gout. NEUROLOGIC: No numbness, weakness, CVA, TIA, or seizures.  PSYCHOLOGICAL: No anxiety, insomnia, depression.  PHYSICAL EXAMINATION:  VITAL SIGNS: Temperature 98.4 degrees Fahrenheit, pulse 96, respirations 20, blood pressure 157/79, pulse oximetry 92% on room air.  GENERAL: Heavily built, well-nourished female lying in bed, not in any acute distress.  HEENT: Normocephalic, atraumatic. Pupils equal, round, reacting to light. Anicteric sclerae. Extraocular  movements intact. Oropharynx clear without any erythema, mass, or exudates.  NECK: Supple. No thyromegaly, JVD, or carotid  bruits. Norman range of motion without any pain.  LUNGS: Moving air bilaterally. No wheeze or crackles. No use of accessory muscles for breathing.  CARDIOVASCULAR: S1 and S2, regular rate and rhythm. A 3/6 systolic murmur heard. No rubs or gallops. Very tender to touch parasternal muscle tender to touch.  ABDOMEN: Soft, nonsteriodals, nondistended, no hepatosplenomegaly, normal bowel sounds.    EXTREMITIES: No pedal edema, no clubbing or cyanosis. Two-plus dorsalis pedis pulses palpable bilaterally. SKIN: No acne, rash, or lesions. LYMPHATICS: No cervical or inguinal lymphadenopathy.  NEUROLOGIC: Cranial nerves intact. No focal motor or sensory deficits.  PSYCHOLOGICAL: Patient is awake, alert, oriented x 3.   LABORATORY DATA: WBC 8.9, hemoglobin 13.1, hematocrit 40.2, platelet count 255,000.   Sodium 137, potassium 4.7, chloride 106, bicarbonate 23, BUN 90, creatinine 2.6, glucose 146, and calcium of 9.9. BNP is 76. CK 154. CK-MB 3.6. Troponin less than 0.02.   Chest x-ray showing no acute infiltrates or pulmonary edema. Mild bibasilar atelectasis is seen. CT of the chest without contrast showing no acute abnormality, cardiomegaly, progressive coronary artery calcification, especially in the LAD and also seen in the aortic arch, descending thoracic aorta without any aneurysm, paraesophageal hernia, dilation of distal esophagus suggesting esophageal dysmotility. Exaggerated thoracic kyphosis is noted. EKG showing sinus rhythm with occasional PVCs. No acute ST-T wave abnormalities.   ASSESSMENT AND PLAN: This is an 81 year old female with history of hypertension, hyperlipidemia, diastolic congestive heart failure, hyperthyroidism admitted for chest pain.  1. Chest pain. Could be esophageal spasm versus reflux as patient has significant paraesophageal hiatal hernia. Will monitor on off unit telemetry. Recycle troponins. IV Protonix b.i.d. and nitroglycerin for the esophageal spasms and Maalox p.r.n.   2. Hypertension. Continue home medications.  3. Hyperlipidemia. Home medications. 4. Diastolic congestive heart failure well compensated.  5. Acute renal failure on chronic kidney disease. BUN and creatinine both are increased. Continue IV fluids. Void nephrotoxins and monitor.  6. Deep vein thrombosis prophylaxis with subcutaneous heparin.   CODE STATUS: Full code.   TIME SPENT ON ADMISSION: 50 minutes.    ____________________________ Gladstone Lighter, MD rk:lt D: 02/18/2014 21:33:53 ET T: 02/18/2014 22:03:15 ET JOB#: [id number]  cc: Gladstone Lighter, MD, <Dictator> Corey Skains, MD Floria Raveling. Astrid Divine, MD Gladstone Lighter MD ELECTRONICALLY SIGNED 02/26/2014 15:04

## 2014-12-29 NOTE — Consult Note (Signed)
Brief Consult Note: Diagnosis: Abdominal pain, upper abdomen in the setting of abnormal findings of gallbladder.  Dilated CBD, sludge and gallbladder wall thickening.  CKD.  Nausea.   Discussed with Attending MD.   Comments: Patient's presentation discussed with Dr. Verdie Shire.  Will proceed with MRCP. Ordered for today to allow further imaging of gallbladder as well as pancreas. Will continue to monitor.  Based on MRCP results decision will be made about whether patient warrants ERCP.  AST and ALT are elevated but total bilirubin is within normal limits.  Electronic Signatures: Payton Emerald (NP)  (Signed 18-Jun-15 14:24)  Authored: Brief Consult Note   Last Updated: 18-Jun-15 14:24 by Payton Emerald (NP)

## 2014-12-29 NOTE — Consult Note (Signed)
Chief Complaint:  Subjective/Chief Complaint Events of the weekend noted. Still with abdominal pain though more in lower abdomen now. Normal WBC. Normal bilirubin. Thickened GB wall. Known hx of diverticulosis by colonoscopy done n 2013 by Dr. Bary Castilla. Also, has UTI.   VITAL SIGNS/ANCILLARY NOTES: **Vital Signs.:   22-Jun-15 07:20  Vital Signs Type Q 8hr  Temperature Temperature (F) 99  Celsius 37.2  Temperature Source oral  Pulse Pulse 84  Respirations Respirations 17  Systolic BP Systolic BP 193  Diastolic BP (mmHg) Diastolic BP (mmHg) 84  Mean BP 111  Pulse Ox % Pulse Ox % 94  Pulse Ox Activity Level  At rest  Oxygen Delivery Room Air/ 21 %   Brief Assessment:  GEN no acute distress   Cardiac Regular   Respiratory clear BS   Gastrointestinal mild diffuse tenderness   Lab Results: Hepatic:  22-Jun-15 04:43   Bilirubin, Total 0.5  Alkaline Phosphatase  121 (45-117 NOTE: New Reference Range 07/28/13)  SGPT (ALT)  125  SGOT (AST)  43  Total Protein, Serum 6.5  Albumin, Serum  2.4  Routine Chem:  22-Jun-15 04:43   Glucose, Serum 99  BUN 11  Creatinine (comp) 0.98  Sodium, Serum 140  Potassium, Serum 3.5  Chloride, Serum 107  CO2, Serum 23  Calcium (Total), Serum 8.9  Osmolality (calc) 279  eGFR (African American) >60  eGFR (Non-African American)  52 (eGFR values <26m/min/1.73 m2 may be an indication of chronic kidney disease (CKD). Calculated eGFR is useful in patients with stable renal function. The eGFR calculation will not be reliable in acutely ill patients when serum creatinine is changing rapidly. It is not useful in  patients on dialysis. The eGFR calculation may not be applicable to patients at the low and high extremes of body sizes, pregnant women, and vegetarians.)  Anion Gap 10   Radiology Results: [place]    18-Jun-15 12:55, [place]  [place]   REASON FOR EXAM:    abnormal gb/pancreas on CT  COMMENTS:    Body Site: Gallbladder, Liver, Common Bile Duct    PROCEDURE: [place] - [place] SURVEY  - Feb 22 2014 12:55PM     CLINICAL DATA:  Abnormal gallbladder on CT scan    EXAM:  [place] - RIGHT UPPER QUADRANT    COMPARISON:  CT scan same day    FINDINGS:  Gallbladder:  There is markedly distended gallbladder. Mobile gallbladder sludge  is noted. Mild thickening of gallbladder wall up to 3.4 mm. No  pericholecystic fluid. No sonographic Murphy's sign could be  evaluated as the patient is with pain medication.    Common bile duct:    Diameter: Distended CBD measuring 13.8 mm in diameter.    Liver:    No focal hepatic mass. Diffuse increased echogenicity of the liver  suspicious for fatty infiltration.     IMPRESSION:  1. Again noted markedly distended gallbladder. Mobile gallbladder  sludge is noted without shadowing gallstones. Mild thickening of  gallbladder wall up to 3.4 mm. Distended CBD measuring 13.8 mm in  diameter. No pericholecystic fluid. Fatty infiltration of the liver.      Electronically Signed    By: LLahoma CrockerM.D.    On: 02/22/2014 13:04         Verified By: LEphraim Hamburger M.D.,  CT:    18-Jun-15 11:10, CT Abdomen and Pelvis Without Contrast  CT Abdomen and Pelvis Without Contrast   REASON  FOR EXAM:    (1) PO contrast only, lower abd pain; (2) PO contrast   only, lower abd pain  COMMENTS:       PROCEDURE: CT  - CT ABDOMEN AND PELVIS W0  - Feb 22 2014 11:10AM     CLINICAL DATA:  Epigastric as well as mid and lower abdominal and  bilateral flank pain with nausea.    EXAM:  CT ABDOMEN AND PELVIS WITHOUT CONTRAST    TECHNIQUE:  Multidetector CT imaging of the abdomen and pelvis was performed  following the standard protocol without IV contrast.  COMPARISON:  CT scan of the chestdated February 18, 2013.    FINDINGS:  The liver exhibits diffuse decreased density consistent with fatty  infiltration. The gallbladder is distended. There is  subjective mild  gallbladder wall thickening. No stones or sludge are demonstrated.  The common bile duct is distended to 15 mm in the pancreatic head. A  discrete pancreatic head mass is not demonstrated, but the  pancreatic head is mildly prominent and its margins ill-defined. The  pancreatic body and tail are normal. The spleen is normal insize  and exhibits focal low-density regions.    The stomach is partially distended and grossly normal. There is a  hiatal hernia. The adrenal glands and kidneys exhibit no acute  abnormalities. There is an extrarenal pelvis on the right. The  caliber of the abdominal aorta is normal. There is no periaortic nor  pericaval lymphadenopathy.    The small and large bowel exhibit no ileus or obstruction or acute  inflammation. The appendix is not discretely demonstrated.    The urinary bladder is normal. The uterus is surgically absent.  There are no adnexal masses nor free fluid.    The lung bases exhibit minimal scarring. There are degenerative  changes at L4-5 and L5-S1. The bony pelvis is unremarkable.     IMPRESSION:  1. There is abnormal distention of the gallbladder and common bile  duct. No gallstones are evident. A discrete pancreatic mass is not  demonstrated but further evaluation initially with abdominal  ultrasound is recommended. MRCP and or MRI of the abdomen may be  indicated to exclude underlying bile duct pathology or pancreatic  malignancy.  2. There are fatty infiltrative changes of the liver. Hypodensities  within the spleen are of uncertain etiology but may reflect cysts.  There is no acute urinary tract or bowel abnormality.      Electronically Signed    By: David  Martinique    On: 02/22/2014 11:22         Verified By: DAVID A. [place]ique, M.D., MD   Assessment/Plan:  Assessment/Plan:  Assessment Dilated CBD with CBD stones, which were extracted during ERCP. Has UTI. May have cholecystitis. Has hx of diverticulosis.    Plan Agree with Abx coverage. Agree with surgery consult to arrange GB surgery. There is little GI to add. Will sign off. Thanks.   Electronic Signatures: Verdie Shire (MD)  (Signed 22-Jun-15 10:39)  Authored: Chief Complaint, VITAL SIGNS/ANCILLARY NOTES, Brief Assessment, Lab Results, Radiology Results, Assessment/Plan   Last Updated: 22-Jun-15 10:39 by Verdie Shire (MD)

## 2014-12-29 NOTE — Consult Note (Signed)
PATIENT NAME:  Whitney Woods, Whitney Woods MR#:  [id number] DATE OF BIRTH:  1925/07/28  DATE OF CONSULTATION:  02/26/2014  REFERRING PHYSICIAN:  Loistine Simas, MD CONSULTING PHYSICIAN:  S.G. Jamal Collin, MD  REASON FOR CONSULTATION: Possible gallbladder pain. Evaluate for the need for removal.   HISTORY OF PRESENT ILLNESS: This is an 81 year old female who was initially admitted here to rule out MI for chest pain and discharged on 06/15 with negative findings. Three days later she presented to the Emergency Room with a complaint of nausea and vomiting and some pain radiating from the epigastric region to her back. Evaluation included a CT and an ultrasound initially which showed a dilated common bile duct, a dilated gallbladder and mild gallbladder wall thickening with no pericholecystic fluid. MRCP was completed showing the presence of an obstructing stone in the distal common bile duct. Surprisingly her liver function showed mild elevation of the enzymes, but bilirubin was entirely normal. Her white count likewise was normal. The patient was seen by GI for ERCP and Dr. Candace Cruise performed ERCP on 06/19 with removal of a stone in the common bile duct. Postprocedure the patient has complained of some vague pains in the upper abdomen, but her liver functions have stayed pretty much unchanged and her lipase is normal. She developed some diarrhea, which seems to have stopped as of yesterday afternoon. I was asked to see this patient in view of the distended gallbladder, presence of sludge and possible symptoms associated with this. The patient at present complains of a little bit of distention of her belly, but is otherwise eating solid food and does not seem to have any nausea or vomiting. She does not describe a focal typical biliary colic-like pain in the right upper quadrant, but she says she has had some intermittent pains in her abdomen kind of shooting across.  PHYSICAL EXAMINATION: GENERAL: The patient is anicteric.   NECK: Supple.  ABDOMEN: Reveals a mildly distended abdomen. There is no focal tenderness although, there is some mild subjective tenderness in different places, particularly in the upper half of the abdomen. No guarding or rebound. Bowel sounds are active.   DIAGNOSTIC DATA: Lab values were reviewed and noted is mild elevation of liver enzymes with a normal bilirubin. Her chemistries show marked improvement. She came in with evidence of dehydration with a creatinine up to 2.7. It is now down to 0.98. Her white count was 9000 on admission and a day later was 5500. She has been afebrile throughout her stay.   The CT scan, ultrasound, and MRCP were revealed.   IMPRESSION AND RECOMMENDATIONS: At this particular point, there is no clear indication to do a cholecystectomy. The only finding of concern is mildly thickened gallbladder wall and distended gallbladder with some sludge but no stones. This patient has been followed by Dr. Bary Castilla in the past who had done prior breast lumpectomy for carcinoma, and I discussed this with him. The plan is for the patient to be seen as an outpatient in the office next week and this will be arranged. In the interim, if there are any changes in her condition we will be available for re-evaluation.   Thank you for allowing me to evaluate and help in the care of this patient.  ____________________________ S.Robinette Haines, MD sgs:sb D: 02/26/2014 10:04:00 ET T: 02/26/2014 10:44:17 ET JOB#: [id number]  cc: S.G. Jamal Collin, MD, <Dictator> [employer] Robinette Haines MD ELECTRONICALLY SIGNED 02/26/2014 13:30

## 2014-12-29 NOTE — H&P (Signed)
PATIENT NAME:  Whitney Woods, Whitney Woods MR#:  [id number] DATE OF BIRTH:  [birth year]  DATE OF ADMISSION:  02/22/2014   PRIMARY CARE PHYSICIAN: Dr. Gayland Curry.  CHIEF COMPLAINT:  Epigastric pain radiating to her back with nausea.  HISTORY OF PRESENT ILLNESS:   This is a very pleasant 81 year old female who was actually discharged on 06/15 with chest pain rule out who presents today with nausea, epigastric pain radiating to her back. This is different from the pain that she was initially admitted for. She denies any sort of chest pain but she has abdominal pain. She had a CT scan in the ER which showed dilation of her CBD and GI has been consulted while patient was in the ER and the plan is for an MRCP.   REVIEW OF SYSTEMS: CONSTITUTIONAL: No fever.  Positive chills.  No fatigue, weakness.  EYES: There is no blurred or double vision. ENT:  Positive hearing loss.  No postnasal drip, allergies. RESPIRATORY: No cough, wheezing, hemoptysis, dyspnea.  CARDIOVASCULAR: No chest pain, orthopnea, edema, arrhythmia, dyspnea on exertion, palpitations, syncope.  GASTROINTESTINAL: Positive nausea. No vomiting. No diarrhea. Positive abdominal pain. No melena or ulcer. GENITOURINARY: No dysuria or hematuria. ENDOCRINE: No Polyuria or polydipsia. HEMATOLOGIC AND LYMPHATIC:  Positive easy bruising. No bleeding. SKIN: No rash or lesions.  MUSCULOSKELETAL: Positive arthritis. No gout. NEUROLOGIC:  No history of CVA or TIA, seizure. PSYCHIATRIC:  No history of anxiety or depression.   PAST MEDICAL HISTORY: 1.  Hypertension. 2.  Hyperlipidemia.  3.  Diastolic heart failure. 4.  Hypothyroidism. 5.  Osteoarthritis. 6.  History of breast cancer status post lumpectomy and radiation. 7.  Osteoporosis. 8.  History of GI bleed secondary to NSAID usage in the past requiring blood transfusion. 9.  Chronic kidney disease, stage III. 10.  Coronary artery disease as per the recent CAT scan.   PAST SURGICAL HISTORY:   Left breast lumpectomy.  ALLERGIES: No known drug allergies.   MEDICATIONS: 1.  Atenolol 25 mg daily p.r.n. if her systolic blood pressure is over 110 or heart rate is over 100.  2.  Aspirin 81 mg daily.  3.  Clobetasol topical applied to affected area daily. 4.  Ferrous sulfate 325 mg daily. 5.  Synthroid 75 mcg daily. 6.  Metoprolol 100 mg daily.  7.  Oxybutynin 1 tablet daily.  8.  Os-Cal 1 tablet b.i.d.  9.  Vitamin B12 1000 mcg daily. 10.  Simvastatin 20 mg at bedtime.  11.  Vitamin D3 1000 international units b.i.d.  PAST SURGICAL HISTORY: 1.  Appendectomy.  2.  Hysterectomy.  3.  Left breast lumpectomy.   SOCIAL HISTORY: No tobacco, alcohol or IV drug use.   FAMILY HISTORY: Positive for CAD.  PHYSICAL EXAMINATION: VITAL SIGNS: Temperature 98.2, pulse 93, respirations 18, blood pressure 124/63, 96% on room air.  GENERAL: The patient is in moderate distress due to her abdominal pain. HEENT:  Head is atraumatic.  Pupils anicteric sclerae.  Mucous membranes are dry.  Oropharynx is clear.   NECK: Supple without JVD, carotid bruit, enlarged thyroid. CARDIOVASCULAR: 2/6 systolic ejection murmur heard best at the left sternal border without radiation. PMI is not displaced.  LUNGS: Clear to auscultation without crackles, rales, rhonchi, wheezing. Normal to percussion.  ABDOMEN: She has had generalized tenderness. No rebound or guarding. She has hypoactive bowel sounds. No masses are palpable.  EXTREMITIES: No clubbing, cyanosis or edema.  NEUROLOGIC:  Cranial nerves II through XII are intact.  No focal deficits. SKIN:  Without rash or lesions.   LABORATORY DATA: Urinalysis shows 3+ LCE with 35 white blood cells. White blood cells 9.4, hemoglobin 12, hematocrit 35, platelets are 197. Sodium 132, potassium 4.2, chloride 100, bicarb 22, BUN 58, creatinine 2.71, glucose is 124.  Bilirubin 0.9, alk phos normal ALT 172, AST 81, total protein 7.1, albumin 3.0.  No troponin.  Ultrasound  of the abdomen showed a markedly distended gallbladder, distended CBD measuring 13.8 mm in diameter.   CT of the abdomen without contrast showed abnormal distention of the gallbladder and common bile duct. No gallstones are evidence. A discrete pancreatic mass is not demonstrated, but further evaluation initially with abdominal ultrasound is recommended. MRCP is recommended.   EKG:  Accelerated junctional rhythm.   ASSESSMENT AND PLAN: A 81 year old female who was recently admitted for chest pain. At that time, found to have a periesophageal hernia and coronary artery calcifications who now presents with abdominal pain and nausea and found to have common bile duct dilation.  1.  Common bile duct dilation.  The patient has been seen by gastroenterology in the Emergency Room. They have recommended magnetic resonance cholangiopancreatography. The patient will undergo an magnetic resonance cholangiopancreatography. Further evaluation and recommendations after the magnetic resonance cholangiopancreatography. We will continue to monitor liver enzymes. Appreciate gastroenterology consultation.  2.  Urinary tract infection. The patient's urinalysis is positive for urinary tract infection. I have placed this patient on Zosyn due to problem #1, which should hopefully cover her urinary tract infection as well, and have ordered a urine culture.  3.  Acute on chronic kidney disease. The patient has baseline creatinine of 1.3 and stage III kidney disease. Her creatinine has continued to increase. We will go ahead and consult nephrology for further evaluation and recommendations.  Hold any nephrotoxic agents. Provide some IV fluids. Repeat a BMP in the a.m.  4.  Hypertension. We will continue her outpatient medications.  5.  Hypothyroidism. We will continue Synthroid.  6.  Hyperlipidemia. Continue simvastatin.  The patient is a FULL CODE status.  TIME SPENT: Approximately 45 minutes.     ____________________________ Donell Beers. Benjie Karvonen, MD spm:ce D: 02/22/2014 14:56:08 ET T: 02/22/2014 15:56:02 ET JOB#: [id number]  cc: Alvaro Aungst P. Benjie Karvonen, MD, <Dictator> Floria Raveling. Astrid Divine, MD Donell Beers Raykwon Hobbs MD ELECTRONICALLY SIGNED 02/22/2014 19:32

## 2014-12-29 NOTE — Consult Note (Signed)
Chief Complaint:  Subjective/Chief Complaint seen in fu s/p ERCP for choledocholithiasis.  patient feeling better, denies nausea or emesis, abdominal pain improved. some diarrhea today, possible red blood with this per nursing.   VITAL SIGNS/ANCILLARY NOTES: **Vital Signs.:   20-Jun-15 08:20  Vital Signs Type Q 8hr  Temperature Temperature (F) 98.6  Celsius 37  Temperature Source oral  Pulse Pulse 75  Respirations Respirations 18  Systolic BP Systolic BP 993  Diastolic BP (mmHg) Diastolic BP (mmHg) 68  Mean BP 87  Pulse Ox % Pulse Ox % 91  Pulse Ox Activity Level  At rest  Oxygen Delivery Room Air/ 21 %  *Intake and Output.:   20-Jun-15 01:33  Stool  small loose watery stool    10:30  Stool  pt had a large amount of watery stool with a little amount of blood in it   Brief Assessment:  Cardiac Regular   Respiratory clear BS   Gastrointestinal details normal Soft  Nondistended  No masses palpable  Bowel sounds normal  No rebound tenderness  minimal discomfort to palpation.   Lab Results: Hepatic:  18-Jun-15 08:50   Bilirubin, Total 0.9  Alkaline Phosphatase 102 (45-117 NOTE: New Reference Range 07/28/13)  SGPT (ALT)  172  SGOT (AST)  81  19-Jun-15 05:17   Bilirubin, Total 0.6  Alkaline Phosphatase  139 (45-117 NOTE: New Reference Range 07/28/13)  SGPT (ALT)  214  SGOT (AST)  137  20-Jun-15 05:20   Bilirubin, Total 0.6  Alkaline Phosphatase  142 (45-117 NOTE: New Reference Range 07/28/13)  SGPT (ALT)  202  SGOT (AST)  90  Total Protein, Serum  5.1  Albumin, Serum  2.3  Routine Chem:  20-Jun-15 05:20   Glucose, Serum 90  BUN 16  Creatinine (comp) 1.01  Sodium, Serum 139  Potassium, Serum 4.0  Chloride, Serum  109  CO2, Serum 22  Calcium (Total), Serum  8.2  Osmolality (calc) 278  eGFR (African American)  58  eGFR (Non-African American)  50 (eGFR values <28m/min/1.73 m2 may be an indication of chronic kidney disease (CKD). Calculated eGFR is useful  in patients with stable renal function. The eGFR calculation will not be reliable in acutely ill patients when serum creatinine is changing rapidly. It is not useful in  patients on dialysis. The eGFR calculation may not be applicable to patients at the low and high extremes of body sizes, pregnant women, and vegetarians.)  Anion Gap 8  Routine Hem:  18-Jun-15 08:50   WBC (CBC) 9.4  19-Jun-15 05:17   WBC (CBC) 5.5   Assessment/Plan:  Assessment/Plan:  Assessment 1) choledocholithiasis-cleared via ercp.  avd pain n/v improved. lfts still elevated. 1) small amount of rectal bleeding reported-likely anal outlet associated with loose stools. last colonoscopy 09/3011, diverticulosis only.   Plan 1) continue current.  2) would consider stools for c+s if diarrhea continues, as she had recent ercp.   3) consider treatment of rectal bleeding with anusol HC cream tid times 10 days.  following.   Electronic Signatures: SLoistine Simas(MD)  (Signed 20-Jun-15 17:03)  Authored: Chief Complaint, VITAL SIGNS/ANCILLARY NOTES, Brief Assessment, Lab Results, Assessment/Plan   Last Updated: 20-Jun-15 17:03 by SLoistine Simas(MD)

## 2014-12-29 NOTE — Consult Note (Signed)
Chief Complaint:  Subjective/Chief Complaint seen for fu ercp and choledocholithiasis.  Continues with epigastric and ruq pain, though mild.  no nausea, tolerating po.  continues with diarrhea, some blood seen with this.   VITAL SIGNS/ANCILLARY NOTES: **Vital Signs.:   21-Jun-15 07:44  Vital Signs Type Q 8hr  Temperature Temperature (F) 99.2  Celsius 37.3  Pulse Pulse 101  Respirations Respirations 17  Systolic BP Systolic BP 983  Diastolic BP (mmHg) Diastolic BP (mmHg) 92  Mean BP 118  Pulse Ox % Pulse Ox % 90  Pulse Ox Activity Level  At rest  Oxygen Delivery Room Air/ 21 %   Brief Assessment:  Cardiac Regular   Respiratory clear BS   Gastrointestinal details normal Soft  Nondistended  Bowel sounds normal  tender to palpation in the right epigastrum and right/left upper quadrants.   Additional Physical Exam DRE-multiple external skin tags with red blood residue, internal hemorrhoid noted, stool not heme positive.  watery stool in vault.   Lab Results: Hepatic:  21-Jun-15 08:01   Bilirubin, Total 0.6  Bilirubin, Direct 0.1 (Result(s) reported on 25 Feb 2014 at 08:58AM.)  Alkaline Phosphatase  142 (45-117 NOTE: New Reference Range 07/28/13)  SGPT (ALT)  158  SGOT (AST)  51  Total Protein, Serum 6.6  Albumin, Serum  2.6   Assessment/Plan:  Assessment/Plan:  Assessment 1) cholelithiasis, choledocholithiasis, s/p ercp.  continues lft elevation, low grade fever and diarrhea. continued ruq discomfort/pain. concern for cholecystitis.   Plan 1) will check lipase. I have requested a consult from Dr Pat Patrick for further opinion/assistance re cholecystitis.   Discussed with Dr Verdell Carmine.   Electronic Signatures: Loistine Simas (MD)  (Signed 21-Jun-15 14:31)  Authored: Chief Complaint, VITAL SIGNS/ANCILLARY NOTES, Brief Assessment, Lab Results, Assessment/Plan   Last Updated: 21-Jun-15 14:31 by Loistine Simas (MD)

## 2014-12-29 NOTE — Discharge Summary (Signed)
PATIENT NAME:  Whitney Woods, Whitney Woods MR#:  [id number] DATE OF BIRTH:  1925-01-22  DATE OF ADMISSION:  02/18/2014 DATE OF DISCHARGE:  02/19/2014  ADMISSION DIAGNOSES: 1.  Chest pain, likely esophageal spasm. 2.  Diastolic heart failure.  3.  Hypertension. 4.  Hyperlipidemia.  DIAGNOSTIC DATA: Pertinent laboratories at discharge: Troponins x3 negative. White blood cell count 9.3, hemoglobin 12.2, hematocrit 39, platelets 217,000.   HOSPITAL COURSE: This is an 81 year old female with a history of hypertension, hyperlipidemia, diastolic heart failure, and hypothyroidism who presented with chest pain. For further details, please refer to the H and P.  1.  Chest pain. Likely this is reflux or esophageal spasm as the patient does have a significant hiatal hernia as per CAT scan. She also has some coronary artery disease. Her troponins were negative. Her chest pain was atypical for cardiac etiology. For her coronary artery disease seen on CT, she will need aspirin and follow up with GI for her esophageal dysmotility as seen by CT scan. I have also referred her to speech as an outpatient to evaluate for swallowing, although the patient says she has no difficulty swallowing. 2.  Hypertension. The patient is to continue on her home medication.  3.  Hyperlipidemia. The patient is to continue on statin.  4.  Diastolic heart failure. This was well compensated and not an issue during this hospitalization.   DISCHARGE MEDICATIONS: 1.  Vitamin D3 1000 international units b.i.d.  2.  Os-Cal 1 tablet b.i.d.  3.  Atenolol 25 mg daily take 1/ tablet when her blood pressure is over 110 and heart rate is over 100. 4.  Vitamin B12 1000 mcg b.i.d.  5.  Simvastatin 20 mg daily.  6.  Oxybutynin 1 tablet daily.  7.  Omeprazole 40 mg daily.  8.  Metoprolol 100 mg daily. 9.  Synthroid 75 mcg daily.  10.  Ferrous sulfate 325 mg daily.  11.  Clobetasol topical b.i.d. p.r.n.  12.  Aspirin 81 mg daily.   DISCHARGE DIET:  Low-sodium.  DISCHARGE ACTIVITY: As tolerated.   DISCHARGE REFERRAL: Home health.   DISCHARGE FOLLOWUP: The patient will follow up with Dr. Candace Cruise in 2 weeks as well as Dr. Astrid Divine.  The patient was medically stable for discharge.   TIME SPENT: 35 minutes.  ____________________________ Donell Beers. Benjie Karvonen, MD spm:sb D: 02/19/2014 12:22:08 ET T: 02/19/2014 12:33:51 ET JOB#: [id number]  cc: Cheick Suhr P. Benjie Karvonen, MD, <Dictator> Floria Raveling. Astrid Divine, MD Lupita Dawn. Candace Cruise, MD Donell Beers Slater Mcmanaman MD ELECTRONICALLY SIGNED 02/20/2014 11:18

## 2014-12-29 NOTE — Consult Note (Signed)
Chief Complaint:  Subjective/Chief Complaint Please see Dawn Harrison's notes from yesterday. Less abd pain today. LFT higher. MRCP abnormal. Tells me pain has been present for long time.   VITAL SIGNS/ANCILLARY NOTES: **Vital Signs.:   19-Jun-15 07:50  Vital Signs Type Q 8hr  Temperature Temperature (F) 98.8  Celsius 37.1  Temperature Source oral  Pulse Pulse 106  Respirations Respirations 17  Systolic BP Systolic BP 767  Diastolic BP (mmHg) Diastolic BP (mmHg) 74  Mean BP 94  Pulse Ox % Pulse Ox % 93  Pulse Ox Activity Level  At rest  Oxygen Delivery Room Air/ 21 %   Brief Assessment:  GEN no acute distress   Cardiac Regular   Respiratory clear BS   Gastrointestinal tender in RUQ area   Lab Results: Hepatic:  19-Jun-15 05:17   Bilirubin, Total 0.6  Alkaline Phosphatase  139 (45-117 NOTE: New Reference Range 07/28/13)  SGPT (ALT)  214  SGOT (AST)  137  Total Protein, Serum 6.4  Routine Chem:  19-Jun-15 05:17   Glucose, Serum 78  BUN  35  Creatinine (comp)  1.60  Sodium, Serum 137  Potassium, Serum 4.0  Chloride, Serum  108  CO2, Serum 22  Calcium (Total), Serum 8.6  Osmolality (calc) 281  eGFR (African American)  33  eGFR (Non-African American)  29 (eGFR values <82m/min/1.73 m2 may be an indication of chronic kidney disease (CKD). Calculated eGFR is useful in patients with stable renal function. The eGFR calculation will not be reliable in acutely ill patients when serum creatinine is changing rapidly. It is not useful in  patients on dialysis. The eGFR calculation may not be applicable to patients at the low and high extremes of body sizes, pregnant women, and vegetarians.)  Anion Gap 7  Routine Hem:  19-Jun-15 05:17   WBC (CBC) 5.5  RBC (CBC)  3.52  Hemoglobin (CBC)  10.4  Hematocrit (CBC)  31.4  Platelet Count (CBC) 179  MCV 89  MCH 29.5  MCHC 33.1  RDW  15.1  Neutrophil % 60.8  Lymphocyte % 18.3  Monocyte % 16.8  Eosinophil % 3.7   Basophil % 0.4  Neutrophil # 3.3  Lymphocyte # 1.0  Monocyte # 0.9  Eosinophil # 0.2  Basophil # 0.0 (Result(s) reported on 23 Feb 2014 at 05:58AM.)   Radiology Results: [place]    18-Jun-15 12:55, [place]  [place]   REASON FOR EXAM:    abnormal gb/pancreas on CT  COMMENTS:   Body Site: Gallbladder, Liver, Common Bile Duct    PROCEDURE: [place] - [place] SURVEY  - Feb 22 2014 12:55PM     CLINICAL DATA:  Abnormal gallbladder on CT scan    EXAM:  [place] - RIGHT UPPER QUADRANT    COMPARISON:  CT scan same day    FINDINGS:  Gallbladder:  There is markedly distended gallbladder. Mobile gallbladder sludge  is noted. Mild thickening of gallbladder wall up to 3.4 mm. No  pericholecystic fluid. No sonographic Murphy's sign could be  evaluated as the patient is with pain medication.    Common bile duct:    Diameter: Distended CBD measuring 13.8 mm in diameter.    Liver:    No focal hepatic mass. Diffuse increased echogenicity of the liver  suspicious for fatty infiltration.     IMPRESSION:  1. Again noted markedly distended gallbladder. Mobile gallbladder  sludge is noted without shadowing gallstones. Mild thickening of  gallbladder  wall up to 3.4 mm. Distended CBD measuring 13.8 mm in  diameter. No pericholecystic fluid. Fatty infiltration of the liver.      Electronically Signed    By: Lahoma Crocker M.D.    On: 02/22/2014 13:04         Verified By: Ephraim Hamburger, M.D.,  CT:    18-Jun-15 11:10, CT Abdomen and Pelvis Without Contrast  CT Abdomen and Pelvis Without Contrast   REASON FOR EXAM:    (1) PO contrast only, lower abd pain; (2) PO contrast   only, lower abd pain  COMMENTS:       PROCEDURE: CT  - CT ABDOMEN AND PELVIS W0  - Feb 22 2014 11:10AM     CLINICAL DATA:  Epigastric as well as mid and lower abdominal and  bilateral flank pain with nausea.    EXAM:  CT ABDOMEN AND PELVIS WITHOUT  CONTRAST    TECHNIQUE:  Multidetector CT imaging of the abdomen and pelvis was performed  following the standard protocol without IV contrast.  COMPARISON:  CT scan of the chestdated February 18, 2013.    FINDINGS:  The liver exhibits diffuse decreased density consistent with fatty  infiltration. The gallbladder is distended. There is subjective mild  gallbladder wall thickening. No stones or sludge are demonstrated.  The common bile duct is distended to 15 mm in the pancreatic head. A  discrete pancreatic head mass is not demonstrated, but the  pancreatic head is mildly prominent and its margins ill-defined. The  pancreatic body and tail are normal. The spleen is normal insize  and exhibits focal low-density regions.    The stomach is partially distended and grossly normal. There is a  hiatal hernia. The adrenal glands and kidneys exhibit no acute  abnormalities. There is an extrarenal pelvis on the right. The  caliber of the abdominal aorta is normal. There is no periaortic nor  pericaval lymphadenopathy.    The small and large bowel exhibit no ileus or obstruction or acute  inflammation. The appendix is not discretely demonstrated.    The urinary bladder is normal. The uterus is surgically absent.  There are no adnexal masses nor free fluid.    The lung bases exhibit minimal scarring. There are degenerative  changes at L4-5 and L5-S1. The bony pelvis is unremarkable.     IMPRESSION:  1. There is abnormal distention of the gallbladder and common bile  duct. No gallstones are evident. A discrete pancreatic mass is not  demonstrated but further evaluation initially with abdominal  ultrasound is recommended. MRCP and or MRI of the abdomen may be  indicated to exclude underlying bile duct pathology or pancreatic  malignancy.  2. There are fatty infiltrative changes of the liver. Hypodensities  within the spleen are of uncertain etiology but may reflect cysts.  There is no acute  urinary tract or bowel abnormality.      Electronically Signed    By: David  Martinique    On: 02/22/2014 11:22         Verified By: DAVID A. [place]ique, M.D., MD   Assessment/Plan:  Assessment/Plan:  Assessment RUQ pain. LFT elevation. MRCP showing distal CBD stone.   Plan Plan ERCP today. Discussed in detail about the procedure. Also, discussed potential risks, incl pancreatitis, bleeding, perforation, reaction to meds, etc. Pt agreed.   Electronic Signatures: Verdie Shire (MD)  (Signed 19-Jun-15 09:26)  Authored: Chief Complaint, VITAL SIGNS/ANCILLARY NOTES, Brief Assessment, Lab Results, Radiology Results, Assessment/Plan   Last  Updated: 19-Jun-15 09:26 by Verdie Shire (MD)

## 2014-12-29 NOTE — Discharge Summary (Signed)
PATIENT NAME:  Whitney Woods, Whitney Woods MR#:  [id number] DATE OF BIRTH:  1924-12-13  DATE OF ADMISSION:  02/22/2014 DATE OF DISCHARGE:  02/26/2014  For a detailed note, please take a look at the history and physical done on admission by Dr. Bettey Costa.   DIAGNOSES AT DISCHARGE: As follows:  1.  Choledocholithiasis, status post endoscopic retrograde cholangiopancreatography and extraction of stones.  2.  Acute diarrhea.  3.  Hypertension.  4.  Hypothyroidism.  5.  Urinary tract infection.   DISCHARGE DIET:  The patient is being discharged on a low-sodium, low-fat diet.   ACTIVITY: As tolerated.   FOLLOWUP: With Dr. Gayland Curry in the next 1 to 2 weeks. Also follow up with Dr. Hervey Ard from general surgery in the next 1 to 2 weeks.   DISCHARGE MEDICATIONS:  Vitamin D3 1000  International Units b.i.d., Os-Cal 1 tab b.i.d., atenolol 25 mg daily, vitamin B12 1000 mcg b.i.d., simvastatin 20 mg daily, oxybutynin 10 mg daily, omeprazole 40 mg daily, metoprolol succinate 100 mg daily, Synthroid 75 mcg daily, iron sulfate 325 mg daily, clobetasol topical 0.05% topical cream to be applied b.i.d., aspirin 81 mg daily, loperamide 2 mg q.i.d. as needed for diarrhea and ciprofloxacin 250 mg b.i.d. x 3 days.   [place] COURSE: Dr. Verdie Shire from gastroenterology. Dr. Jamal Collin from general surgery.   PERTINENT STUDIES DONE DURING THE HOSPITAL COURSE: Are as follows: A CT scan of the abdomen and pelvis done without contrast on admission showing abnormal distention of the gallbladder and common bile duct. No gallstones are evident. Discrete pancreatic mass is not demonstrated. Fatty infiltration of the liver. An MRCP done which showed examination is positive for choledocholithiasis, explaining the common bile duct dilatation. A limited abdominal ultrasound showing markedly distended gallbladder, multiple gallbladder sludge is noted without shattering of gallstones, mild thickening of the  gallbladder wall.   HOSPITAL COURSE: This is an 81 year old female with medical problems as mentioned above, presented to the hospital with epigastric abdominal pain and nausea and noted to have a distended gallbladder with common bile duct dilatation and likely choledocholithiasis.  1.  Choledocholithiasis. This is likely the cause of the patient's abdominal pain, nausea and vomiting and abnormal liver function tests. The patient, after having a positive MRCP,  underwent ERCP done by Dr. Candace Cruise. She had a sphincterotomy and a stone removal done. Post-ERCP, the patient's LFTs have improved. Her pain has also improved but is now completely gone. She has had intermittent nausea but that has also improved. She is currently tolerating a regular diet and her LFTs have improved as mentioned. She likely needs a cholecystectomy in the near future. Therefore, this is to be arranged as an outpatient through her surgeon, who she who she has seen the past which happens to Dr. Bary Castilla.  2.  Acute diarrhea. The exact etiology of this is unclear. I suspect this is probably related to her being on broad-spectrum antibiotics when she presented to the hospital. She was on Zosyn. This has been discontinued since then. She had a C. difficile toxin assay done which has also been negative. Her diarrhea has improved with some as-needed Imodium, which she currently is being discharged on. She is tolerating a regular diet well with no evidence of nausea or vomiting.  3.  Urinary tract infection. This is secondary to group G strep. She is on oral Cipro. She will continue it for the next 3 days and finish treatment for a urinary tract infection.  4.  Acute on chronic kidney disease. The patient has a baseline creatinine of 1.3. He likely has underlying stage 2 kidney disease. Her acute kidney disease was probably secondary to her dehydration from her choledocholithiasis and nausea or vomiting. She was, therefore, hydrated with IV fluids and  her BUN and creatinine is currently back to baseline.  5.  Hypertension. The patient was maintained on metoprolol. She will resume that.  6.  Hypothyroidism. The patient was maintained on her Synthroid and she will also resume that.  7.  Hyperlipidemia. The patient was maintained on simvastatin and she will also resume that upon discharge.   CODE STATUS: The patient is a full code.   DISPOSITION: She is being discharged home with close followup with general surgery with Dr. Bary Castilla as an outpatient.   TIME SPENT ON DISCHARGE: 45 minutes.    ____________________________ Belia Heman. Verdell Carmine, MD vjs:cs D: 02/26/2014 16:36:00 ET T: 02/26/2014 20:20:42 ET JOB#: [id number]  cc: Floria Raveling. Astrid Divine, MD [place] Verdell Carmine, MD, <Dictator> Robert Bellow, MD    Henreitta Leber MD ELECTRONICALLY SIGNED 03/06/2014 20:32

## 2014-12-29 NOTE — Consult Note (Signed)
PATIENT NAME:  Whitney Woods, Whitney Woods MR#:  [id number] DATE OF BIRTH:  Feb 24, 1925  DATE OF CONSULTATION:  02/22/2014  REFERRING PHYSICIAN:  Dr. Corky Downs CONSULTING PHYSICIAN: Verdie Shire, MD / Payton Emerald, NP  PRIMARY CARDIOLOGIST: Serafina Royals, MD  REASON FOR CONSULTATION: Abdominal pain, abnormal CT scan findings.   HISTORY OF PRESENT ILLNESS: Ms. Cuda is an 81 year old Caucasian female who has a significant past medical history of hypertension, congestive heart failure, hyperlipidemia, hypothyroidism, osteoporosis, hiatal hernia, and breast cancer status post radiation therapy. She was hospitalized 02/18/2014 for substernal mid chest pain. At that time, she had a CT scan of chest done which found evidence of hiatal hernia. She was discharged with diagnosis of hiatal hernia being underlying etiological cause of her symptoms and to follow up with Dr. Verdie Shire as well as myself on an outpatient basis. She presented to the Emergency Room today as has change in her symptoms over the last day or so experiencing epigastric abdominal pain, bilateral upper quadrants of abdomen, pain to lumbar spine, associated nausea, no vomiting. Significant for chills which she feels is related to the fact that she has not slept well in the last 2 nights. Last bowel movement was today after having drank CT scan contrast.   CT scan of abdomen and pelvis without contrast revealed liver to exhibit diffuse decreased density consistent with fatty infiltration, gallbladder distended, subjective mild gallbladder wall thickening. No stones. No sludge. Noted common bile duct is 15 mm, in the pancreatic head. Discrete pancreatic head mass could is not demonstrated, but the pancreatic head is mildly prominent and its margins are ill defined. The pancreatic body and tail are normal. Spleen is normal in size and exhibits focal low-density region. The stomach is partially distended but grossly normal. There is a hiatal hernia. Adrenal glands  and kidneys exhibit no acute abnormalities. There is extra renal pelvis on the right. Small and large bowel exhibit no ileus or obstruction or acute inflammation. Ultrasound was also performed of abdomen today, which reveals the gallbladder to be markedly distended, mobile gallbladder sludge is noted, mild thickening of the gallbladder wall measuring 3.4 mm. No pericholecystic fluid. No sonographic Murphy's sign could be evaluated as the patient had pain medication. Common bile duct is distended at 13.8 mm in diameter. Liver no focal hepatic mass, diffuse increased echogenicity of the liver suspicious for fatty infiltration. Laboratory studies revealed glucose to be elevated at 124, BUN is 58, and creatinine is 2.71 with a sodium of 132. Albumin is 3 with AST of 81 and ALT of 172. Hemoglobin 11.4 with hematocrit 34.3 and RDW 15.3. Urinalysis reveals +3 leukocytes, +3 bacteria. EGD was performed by Dr. Verdie Shire in 2013 with a few medium-sized angiodysplastic lesions with stigma of recent bleeding being found in the gastric fundus. Coagulation with hemostasis using heater probe was successful. Localized mild inflammation was found in the gastric fundus. A few recently bleeding AVMs were also noted in the stomach, gastritis. EGD is also documented as being done October 18th as well prior to the 21st one with esophagus normal, duodenum normal. Clotted blood was found in the gastric fundus. Lavage of this area was performed using a large amount of tap water resulting in clearance with fair visualization. Clots in the fundus was lavaged and suctioned away. There appeared to be fresh blood in the fundus, however, he was unable to visualize any evidence of ulcer or AVM to cauterize at that time. A clot of blood was also found in  the gastric fundus. Colonoscopy was done by Dr. Hervey Ard in 2013 which revealed evidence of diverticulosis.   PAST MEDICAL HISTORY: Hypertension, hyperlipidemia, diastolic congestive heart  failure, hypothyroidism, osteoarthritis, breast cancer status post lumpectomy and radiation therapy, osteoporosis, osteoarthritis, history of GI bleed related to AVMs and chronic renal disease.   PAST SURGICAL HISTORY: Lumpectomy, multiple EGDs in 2013, colonoscopy in 2013.   ALLERGIES: None.   HOME MEDICATIONS: Atenolol 25 mg once a day, clobetasol 0.5% topical cream twice a day, ferrous sulfate 325 mg once a day, hydrochlorothiazide 12.5 mg once a day, levothyroxine 75 mcg once a day, lisinopril 20 mg daily, Toprol 100 mg a day, omeprazole 40 mg a day, Os-Cal 1 tablet p.o. b.i.d., oxybutynin 10 mg per 24 hours extended release tablet, simvastatin 20 mg daily, vitamin B12 12,000 mcg p.o. b.i.d., vitamin D 3000 international units b.i.d.   SOCIAL HISTORY: Resides by herself. Ambulates with a walker. No tobacco. No alcohol use.   FAMILY HISTORY: Significant for dad with a stroke, mom with diabetes, grandmother maternal breast cancer.   REVIEW OF SYSTEMS: CONSTITUTIONAL: Significant for fatigue, weakness, chills. No documented fevers.  EYES: No blurred vision, inflammation, glaucoma.  ENT: No tinnitus, hearing loss, epistaxis.  RESPIRATORY: No coughing, no wheezing.  CARDIOVASCULAR: No chest pain at this time. No heart palpitations.  GASTROINTESTINAL: See HPI. GENITOURINARY: Difficulty with voiding, hesitancy, but no dysuria.  ENDOCRINE: No polyuria, thyroid problems, heat or cold intolerance.  HEMATOLOGIC: Denies significant easy bruising and bleeding.  SKIN: No rashes. No lesions.  MUSCULOSKELETAL: No neuralgias or myalgias.  NEUROLOGIC: No history of CVA or TIA. PSYCHIATRIC: No depression. No anxiety.   PHYSICAL EXAMINATION: VITAL SIGNS: Pulse is 93, respirations 18, blood pressure 124/63, pulse ox 96% on room air.  GENERAL: Well-developed, well-nourished 81 year old Caucasian female, no acute distress noted. Pleasant.  HEENT: Normocephalic, atraumatic. Pupils equal and reactive to  light. Conjunctivae clear. Sclerae anicteric.  NECK: Supple. Trachea midline. No lymphadenopathy or thyromegaly.  PULMONARY: Symmetric rise and fall of chest. Clear to auscultation throughout.  CARDIOVASCULAR: Regular rhythm, S1 and S2. No murmurs, no gallops.  ABDOMEN: Soft, nondistended. Bowel sounds hypoactive. No bruits. No masses. No evidence of hepatosplenomegaly. No abdominal discomfort. No rebound tenderness.  RECTAL: Deferred.  MUSCULOSKELETAL: Moving all 4 extremities. No contractures. No clubbing.  EXTREMITIES: No edema.  PSYCHIATRIC: Alert and oriented x4. Memory grossly intact. Appropriate affect and mood.   DIAGNOSTIC DATA: All laboratories/diagnostic studies as noted above, reviewed by myself during this ER evaluation and admission.   IMPRESSION:  1.  Abdominal pain. Subjectively epigastric as well as bilateral quadrant per patient. Family members stated she has been complaining of generalized pain, associated nausea. Recent admission for chest pain and was found to have hiatal hernia. History of upper GI bleed. History of arteriovenous malformations noted on EGD in 2013.  2.  Chronic renal disease.  3.  Nausea. 4.  Abnormal CT scan as well as abdominal ultrasound, findings for evidence of thickness to gallbladder as well as enlargement of common bile duct.   PLAN: The patient's presentation was discussed with Dr. Verdie Shire. Recommendation is to have the patient remain n.p.o. at this time. Goal is to proceed forward with MRCP today to allow further imaging of common bile duct to assess for possible obstructive process such as gallstone or lesion. Evidence of sludge noted on prior imaging studies. Order placed. Once MRCP results have been reviewed, decision will be made how to proceed forward or whether an ERCP is  warranted at this time. We will continue to monitor.   These services provided by Payton Emerald, MS, APRN, [facility], FNP under collaborative agreement with Verdie Shire, MD.    ____________________________ Payton Emerald, NP dsh:sb D: 02/22/2014 14:21:44 ET T: 02/22/2014 16:59:33 ET JOB#: [id number]  cc: Payton Emerald, NP, <Dictator> Payton Emerald MD ELECTRONICALLY SIGNED 02/22/2014 17:47

## 2015-04-03 ENCOUNTER — Other Ambulatory Visit: Payer: Self-pay

## 2015-04-03 DIAGNOSIS — Z853 Personal history of malignant neoplasm of breast: Secondary | ICD-10-CM

## 2015-05-20 ENCOUNTER — Ambulatory Visit
Admission: RE | Admit: 2015-05-20 | Discharge: 2015-05-20 | Disposition: A | Discharge status: Home / Self Care | Payer: Medicare Other | Source: Ambulatory Visit | Attending: General Surgery | Admitting: General Surgery

## 2015-05-20 ENCOUNTER — Other Ambulatory Visit: Payer: Self-pay | Admitting: General Surgery

## 2015-05-20 DIAGNOSIS — Z853 Personal history of malignant neoplasm of breast: Secondary | ICD-10-CM | POA: Diagnosis present

## 2015-05-24 ENCOUNTER — Other Ambulatory Visit: Payer: Self-pay | Admitting: Neurology

## 2015-05-24 DIAGNOSIS — M542 Cervicalgia: Secondary | ICD-10-CM

## 2015-05-28 ENCOUNTER — Encounter: Payer: Self-pay | Admitting: General Surgery

## 2015-05-28 ENCOUNTER — Ambulatory Visit: Payer: Medicare Other

## 2015-05-28 ENCOUNTER — Ambulatory Visit (INDEPENDENT_AMBULATORY_CARE_PROVIDER_SITE_OTHER): Payer: Medicare Other | Admitting: General Surgery

## 2015-05-28 ENCOUNTER — Ambulatory Visit: Payer: Medicare Other | Admitting: General Surgery

## 2015-05-28 VITALS — BP 120/52 | HR 88 | Resp 12 | Ht 64.0 in | Wt 168.0 lb

## 2015-05-28 DIAGNOSIS — Z853 Personal history of malignant neoplasm of breast: Secondary | ICD-10-CM | POA: Diagnosis not present

## 2015-05-28 NOTE — Progress Notes (Signed)
Patient ID: Whitney Woods, female   DOB: [birth year], 81 y.o.   MRN: [id number]  Chief Complaint  Patient presents with  . Follow-up    mammogram    HPI Whitney Woods is a 81 y.o. female who presents for a breast evaluation. The most recent mammogram was done on 05/20/15 .  Patient does perform regular self breast checks and gets regular mammograms done.  No new breast complaints.  HPI  Past Medical History  Diagnosis Date  . Arthritis 10 years  . Hypertension 10 years  . Heart murmur 10 years  . Thyroid disease   . Anemia   . Dizziness   . CHF (congestive heart failure)     Dr Nehemiah Massed  . Malignant neoplasm of lower-inner quadrant of female breast 2012    Invasive lobular carcinoma of the left breast, LCIS. 2.2 cm. Negative sentinel node. ER/PR negative., HER-2/neu nonamplified.    Past Surgical History  Procedure Laterality Date  . Upper gi endoscopy  2013  . Colonoscopy  2013    [name]  . Abdominal hysterectomy  age 12     partial  . Appendectomy  age 61  . Breast lumpectomy Left 2012    with radiation  . Gallstones removed  03/04/14    ERCP  . Eye surgery Bilateral   . Cosmetic surgery    . Ercp w/ sphicterotomy  June 2015, Verdie Shire,  MD    Follow-up ultrasound showed no residual cholelithiasis.    Family History  Problem Relation Age of Onset  . Breast cancer Maternal Grandmother 70    Social History Social History  Substance Use Topics  . Smoking status: Never Smoker   . Smokeless tobacco: Never Used  . Alcohol Use: No    No Known Allergies  Current Outpatient Prescriptions  Medication Sig Dispense Refill  . aspirin 81 MG tablet Take 81 mg by mouth daily.    Marland Kitchen atenolol (TENORMIN) 25 MG tablet Take 25 mg by mouth daily.    . Cholecalciferol (VITAMIN D-3) 1000 UNITS CAPS Take 1 capsule by mouth 3 (three) times daily.    . clobetasol cream (TEMOVATE) 4.09 % Apply 1 application topically 2 (two) times daily.    . ferrous sulfate 325 (65 FE) MG  tablet Take 325 mg by mouth daily with breakfast.    . levothyroxine (SYNTHROID, LEVOTHROID) 75 MCG tablet Take 75 mcg by mouth daily before breakfast.    . lisinopril (PRINIVIL,ZESTRIL) 20 MG tablet Take 20 mg by mouth daily.     Marland Kitchen loperamide (IMODIUM) 2 MG capsule Take 2 mg by mouth as needed for diarrhea or loose stools.    . metoprolol succinate (TOPROL-XL) 100 MG 24 hr tablet Take 100 mg by mouth daily. Take with or immediately following a meal.    . omeprazole (PRILOSEC) 40 MG capsule Take 40 mg by mouth daily.    Marland Kitchen oxybutynin (DITROPAN-XL) 10 MG 24 hr tablet Take 10 mg by mouth at bedtime.    . simvastatin (ZOCOR) 20 MG tablet Take 20 mg by mouth daily.    . vitamin B-12 (CYANOCOBALAMIN) 1000 MCG tablet Take 1,000 mcg by mouth daily.     No current facility-administered medications for this visit.    Review of Systems Review of Systems  Constitutional: Negative.   Respiratory: Negative.   Cardiovascular: Negative.     Blood pressure 120/52, pulse 88, resp. rate 12, height _0  (1.626 m), weight 168 lb (76.204 kg).  Physical Exam Physical  Exam  Constitutional: She is oriented to person, place, and time. She appears well-developed and well-nourished.  HENT:  Mouth/Throat: Oropharynx is clear and moist.  Eyes: Conjunctivae are normal. No scleral icterus.  Neck: Neck supple.  Cardiovascular: Normal rate, regular rhythm and normal heart sounds.   Pulmonary/Chest: Effort normal and breath sounds normal. Right breast exhibits no inverted nipple, no mass, no nipple discharge, no skin change and no tenderness. Left breast exhibits no inverted nipple, no mass, no nipple discharge, no skin change and no tenderness.    Left breast incisoin at 6 o'clock is healed.  Abdominal: Soft. Normal appearance. There is no tenderness.  Lymphadenopathy:    She has no cervical adenopathy.    She has no axillary adenopathy.  Neurological: She is alert and oriented to person, place, and time.   Skin: Skin is warm and dry.  Psychiatric: Her behavior is normal.    Data Reviewed Bilateral mammograms dated 05/20/2015 showed postsurgical changes. BI-RADS-2.  Assessment    Doing well status post wide excision left breast invasive lobular carcinoma.  No recurrent abdominal pain.    Plan         The patient has been asked to return to the office in one year with a bilateral diagnostic mammogram.  PCP:  Arta Silence 05/28/2015, 9:02 PM

## 2015-05-28 NOTE — Patient Instructions (Signed)
The patient has been asked to return to the office in one year with a bilateral diagnostic mammogram. 

## 2015-06-04 ENCOUNTER — Ambulatory Visit
Admission: RE | Admit: 2015-06-04 | Discharge: 2015-06-04 | Disposition: A | Discharge status: Home / Self Care | Payer: Medicare Other | Source: Ambulatory Visit | Attending: Neurology | Admitting: Neurology

## 2015-06-04 DIAGNOSIS — M542 Cervicalgia: Secondary | ICD-10-CM | POA: Insufficient documentation

## 2015-09-26 ENCOUNTER — Inpatient Hospital Stay
Admission: EM | Admit: 2015-09-26 | Discharge: 2015-09-27 | DRG: 683 | Disposition: A | Discharge status: Home Health | Payer: Medicare Other | Attending: Specialist | Admitting: Specialist

## 2015-09-26 ENCOUNTER — Emergency Department: Payer: Medicare Other

## 2015-09-26 ENCOUNTER — Inpatient Hospital Stay: Payer: Medicare Other

## 2015-09-26 ENCOUNTER — Encounter: Payer: Self-pay | Admitting: Emergency Medicine

## 2015-09-26 DIAGNOSIS — M1990 Unspecified osteoarthritis, unspecified site: Secondary | ICD-10-CM | POA: Diagnosis present

## 2015-09-26 DIAGNOSIS — Z79899 Other long term (current) drug therapy: Secondary | ICD-10-CM | POA: Diagnosis not present

## 2015-09-26 DIAGNOSIS — I5032 Chronic diastolic (congestive) heart failure: Secondary | ICD-10-CM | POA: Diagnosis present

## 2015-09-26 DIAGNOSIS — I110 Hypertensive heart disease with heart failure: Secondary | ICD-10-CM | POA: Diagnosis present

## 2015-09-26 DIAGNOSIS — K219 Gastro-esophageal reflux disease without esophagitis: Secondary | ICD-10-CM | POA: Diagnosis present

## 2015-09-26 DIAGNOSIS — E785 Hyperlipidemia, unspecified: Secondary | ICD-10-CM | POA: Diagnosis present

## 2015-09-26 DIAGNOSIS — R7989 Other specified abnormal findings of blood chemistry: Secondary | ICD-10-CM | POA: Diagnosis present

## 2015-09-26 DIAGNOSIS — E860 Dehydration: Secondary | ICD-10-CM | POA: Diagnosis present

## 2015-09-26 DIAGNOSIS — N179 Acute kidney failure, unspecified: Secondary | ICD-10-CM | POA: Diagnosis present

## 2015-09-26 DIAGNOSIS — Z7982 Long term (current) use of aspirin: Secondary | ICD-10-CM | POA: Diagnosis not present

## 2015-09-26 DIAGNOSIS — E039 Hypothyroidism, unspecified: Secondary | ICD-10-CM | POA: Diagnosis present

## 2015-09-26 DIAGNOSIS — Z853 Personal history of malignant neoplasm of breast: Secondary | ICD-10-CM | POA: Diagnosis not present

## 2015-09-26 DIAGNOSIS — N390 Urinary tract infection, site not specified: Secondary | ICD-10-CM | POA: Diagnosis present

## 2015-09-26 DIAGNOSIS — N289 Disorder of kidney and ureter, unspecified: Secondary | ICD-10-CM

## 2015-09-26 LAB — URINALYSIS COMPLETE WITH MICROSCOPIC (ARMC ONLY)
Bilirubin Urine: NEGATIVE
GLUCOSE, UA: NEGATIVE mg/dL
KETONES UR: NEGATIVE mg/dL
Nitrite: NEGATIVE
Protein, ur: NEGATIVE mg/dL
SPECIFIC GRAVITY, URINE: 1.012 (ref 1.005–1.030)
pH: 5 (ref 5.0–8.0)

## 2015-09-26 LAB — COMPREHENSIVE METABOLIC PANEL
ALBUMIN: 4.1 g/dL (ref 3.5–5.0)
ALK PHOS: 51 U/L (ref 38–126)
ALT: 24 U/L (ref 14–54)
ANION GAP: 10 (ref 5–15)
AST: 20 U/L (ref 15–41)
BILIRUBIN TOTAL: 0.7 mg/dL (ref 0.3–1.2)
BUN: 75 mg/dL — ABNORMAL HIGH (ref 6–20)
CALCIUM: 9.8 mg/dL (ref 8.9–10.3)
CO2: 25 mmol/L (ref 22–32)
Chloride: 106 mmol/L (ref 101–111)
Creatinine, Ser: 2.23 mg/dL — ABNORMAL HIGH (ref 0.44–1.00)
GFR calc non Af Amer: 19 mL/min — ABNORMAL LOW (ref >60)
GFR, EST AFRICAN AMERICAN: 21 mL/min — AB (ref >60)
GLUCOSE: 124 mg/dL — AB (ref 65–99)
Potassium: 4.4 mmol/L (ref 3.5–5.1)
Sodium: 141 mmol/L (ref 135–145)
TOTAL PROTEIN: 8 g/dL (ref 6.5–8.1)

## 2015-09-26 LAB — CBC
HCT: 40.7 % (ref 35.0–47.0)
HEMOGLOBIN: 13.2 g/dL (ref 12.0–16.0)
MCH: 30.4 pg (ref 26.0–34.0)
MCHC: 32.3 g/dL (ref 32.0–36.0)
MCV: 94 fL (ref 80.0–100.0)
PLATELETS: 325 10*3/uL (ref 150–440)
RBC: 4.33 MIL/uL (ref 3.80–5.20)
RDW: 13.6 % (ref 11.5–14.5)
WBC: 9.5 10*3/uL (ref 3.6–11.0)

## 2015-09-26 LAB — TROPONIN I: Troponin I: <0.03 ng/mL (ref <0.031)

## 2015-09-26 LAB — LIPASE, BLOOD: Lipase: 26 U/L (ref 11–51)

## 2015-09-26 LAB — TSH: TSH: 1.208 u[IU]/mL (ref 0.350–4.500)

## 2015-09-26 MED ORDER — HEPARIN SODIUM (PORCINE) 5000 UNIT/ML IJ SOLN
5000.0000 [IU] | Freq: Three times a day (TID) | INTRAMUSCULAR | Status: DC
  Administered 2015-09-26: 5000 [IU] via SUBCUTANEOUS
  Filled 2015-09-26: qty 1

## 2015-09-26 MED ORDER — SODIUM CHLORIDE 0.9 % IV SOLN
INTRAVENOUS | Status: DC
  Administered 2015-09-26: 20:00:00 via INTRAVENOUS

## 2015-09-26 MED ORDER — ACETAMINOPHEN 650 MG RE SUPP: 650.0000 mg | Freq: Four times a day (QID) | RECTAL | Status: DC | PRN

## 2015-09-26 MED ORDER — OXYBUTYNIN CHLORIDE 5 MG PO TABS
5.0000 mg | ORAL_TABLET | Freq: Two times a day (BID) | ORAL | Status: DC
  Administered 2015-09-26 – 2015-09-27 (×2): 5 mg via ORAL
  Filled 2015-09-26 (×2): qty 1

## 2015-09-26 MED ORDER — PANTOPRAZOLE SODIUM 40 MG PO TBEC
40.0000 mg | DELAYED_RELEASE_TABLET | Freq: Every day | ORAL | Status: DC
  Administered 2015-09-26 – 2015-09-27 (×2): 40 mg via ORAL
  Filled 2015-09-26 (×2): qty 1

## 2015-09-26 MED ORDER — DEXTROSE 5 % IV SOLN
1.0000 g | INTRAVENOUS | Status: DC
  Administered 2015-09-26: 1 g via INTRAVENOUS
  Filled 2015-09-26 (×2): qty 10

## 2015-09-26 MED ORDER — LEVOTHYROXINE SODIUM 75 MCG PO TABS: 75.0000 ug | ORAL_TABLET | Freq: Every day | ORAL | Status: DC

## 2015-09-26 MED ORDER — ASPIRIN EC 81 MG PO TBEC
81.0000 mg | DELAYED_RELEASE_TABLET | Freq: Every day | ORAL | Status: DC
  Administered 2015-09-26 – 2015-09-27 (×2): 81 mg via ORAL
  Filled 2015-09-26 (×2): qty 1

## 2015-09-26 MED ORDER — SIMVASTATIN 20 MG PO TABS
20.0000 mg | ORAL_TABLET | Freq: Every day | ORAL | Status: DC
  Administered 2015-09-26 – 2015-09-27 (×2): 20 mg via ORAL
  Filled 2015-09-26 (×2): qty 1

## 2015-09-26 MED ORDER — ACETAMINOPHEN 325 MG PO TABS
650.0000 mg | ORAL_TABLET | Freq: Four times a day (QID) | ORAL | Status: DC | PRN
  Administered 2015-09-27: 650 mg via ORAL
  Filled 2015-09-26: qty 2

## 2015-09-26 MED ORDER — SODIUM CHLORIDE 0.9 % IV BOLUS (SEPSIS)
1000.0000 mL | Freq: Once | INTRAVENOUS | Status: AC
  Administered 2015-09-26: 1000 mL via INTRAVENOUS

## 2015-09-26 MED ORDER — METOPROLOL SUCCINATE ER 25 MG PO TB24
25.0000 mg | ORAL_TABLET | Freq: Two times a day (BID) | ORAL | Status: DC
  Administered 2015-09-26 – 2015-09-27 (×2): 25 mg via ORAL
  Filled 2015-09-26 (×2): qty 1

## 2015-09-26 NOTE — H&P (Signed)
[facility] NAME: Whitney Woods    MR#:  [id number]  DATE OF BIRTH:  March 24, 1925  DATE OF ADMISSION:  09/26/2015  PRIMARY CARE PHYSICIAN: Gayland Curry, MD   REQUESTING/REFERRING PHYSICIAN: Dr. Burlene Arnt  CHIEF COMPLAINT:   Chief Complaint  Patient presents with  . Dehydration    HISTORY OF PRESENT ILLNESS:  Whitney Woods  is a 82 y.o. female with a known history of chronic diastolic heart failure, chronic dizziness, hypothyroidism, hypertension presents today with worsening dizziness and fatigue. According to the family she has had decreased oral intake for several days. She reports feeling even more dizzy than usual describes the room spinning even at rest. She has been very sleepy and has slept through the past 2 days and nights and then fairly disoriented when trying to wake up. No fevers, chills, nausea, vomiting. On emergency room evaluation she is found to be in acute renal failure and to have a urinary tract infection.  PAST MEDICAL HISTORY:   Past Medical History  Diagnosis Date  . Arthritis 10 years  . Hypertension 10 years  . Heart murmur 10 years  . Thyroid disease   . Anemia   . Dizziness   . CHF (congestive heart failure) (HCC)     Dr Nehemiah Massed  . Malignant neoplasm of lower-inner quadrant of female breast ([facility]) 2012    Invasive lobular carcinoma of the left breast, LCIS. 2.2 cm. Negative sentinel node. ER/PR negative., HER-2/neu nonamplified.    PAST SURGICAL HISTORY:   Past Surgical History  Procedure Laterality Date  . Upper gi endoscopy  2013  . Colonoscopy  2013    [name]  . Abdominal hysterectomy  age 53     partial  . Appendectomy  age 32  . Breast lumpectomy Left 2012    with radiation  . Gallstones removed  03/04/14    ERCP  . Eye surgery Bilateral   . Cosmetic surgery    . Ercp w/ sphicterotomy  June 2015, Verdie Shire,  MD    Follow-up ultrasound showed no residual cholelithiasis.     SOCIAL HISTORY:   Social History  Substance Use Topics  . Smoking status: Never Smoker   . Smokeless tobacco: Never Used  . Alcohol Use: No    FAMILY HISTORY:   Family History  Problem Relation Age of Onset  . Breast cancer Maternal Grandmother 8    DRUG ALLERGIES:  No Known Allergies  REVIEW OF SYSTEMS:   Review of Systems  Constitutional: Positive for malaise/fatigue. Negative for fever, chills and weight loss.  HENT: Negative for congestion and hearing loss.   Eyes: Negative for blurred vision and pain.  Respiratory: Positive for shortness of breath. Negative for cough, hemoptysis, sputum production and stridor.   Cardiovascular: Negative for chest pain, palpitations, orthopnea and leg swelling.  Gastrointestinal: Negative for nausea, vomiting, abdominal pain, diarrhea, constipation and blood in stool.  Genitourinary: Negative for dysuria and frequency.  Musculoskeletal: Positive for neck pain. Negative for myalgias, back pain and joint pain.  Skin: Negative for rash.  Neurological: Positive for dizziness and weakness. Negative for focal weakness, loss of consciousness and headaches.  Endo/Heme/Allergies: Does not bruise/bleed easily.  Psychiatric/Behavioral: Negative for depression and hallucinations. The patient is not nervous/anxious.     MEDICATIONS AT HOME:   Prior to Admission medications   Medication Sig Start Date End Date Taking? Authorizing Provider  aspirin 81 MG tablet Take 81 mg by mouth daily.  Yes Historical Provider, MD  Cholecalciferol (VITAMIN D-3) 1000 UNITS CAPS Take 2 capsules by mouth daily.    Yes Historical Provider, MD  clobetasol cream (TEMOVATE) 1.15 % Apply 1 application topically 2 (two) times daily.   Yes Historical Provider, MD  ferrous sulfate 325 (65 FE) MG tablet Take 325 mg by mouth 3 (three) times a week.    Yes Historical Provider, MD  furosemide (LASIX) 40 MG tablet Take 40 mg by mouth daily. 09/09/15  Yes Historical Provider,  MD  gabapentin (NEURONTIN) 100 MG capsule Take 100 mg by mouth daily as needed.   Yes Historical Provider, MD  levothyroxine (SYNTHROID, LEVOTHROID) 75 MCG tablet Take 75 mcg by mouth daily before breakfast.   Yes Historical Provider, MD  lisinopril (PRINIVIL,ZESTRIL) 20 MG tablet Take 20 mg by mouth daily.  04/23/14  Yes Historical Provider, MD  loperamide (IMODIUM) 2 MG capsule Take 2 mg by mouth as needed for diarrhea or loose stools.   Yes Historical Provider, MD  metoprolol succinate (TOPROL-XL) 100 MG 24 hr tablet Take 25 mg by mouth 2 (two) times daily. Take with or immediately following a meal.   Yes Historical Provider, MD  omeprazole (PRILOSEC) 40 MG capsule Take 40 mg by mouth daily.   Yes Historical Provider, MD  oxybutynin (DITROPAN) 5 MG tablet Take 5 mg by mouth 2 (two) times daily. 09/09/15  Yes Historical Provider, MD  simvastatin (ZOCOR) 20 MG tablet Take 20 mg by mouth daily.   Yes Historical Provider, MD  vitamin B-12 (CYANOCOBALAMIN) 1000 MCG tablet Take 1,000 mcg by mouth daily.   Yes Historical Provider, MD      VITAL SIGNS:  Blood pressure 119/80, pulse 69, temperature 98 F (36.7 C), temperature source Oral, resp. rate 16, height 5' 2"  (1.575 m), weight 72.576 kg (160 lb), SpO2 97 %.  PHYSICAL EXAMINATION:  GENERAL:  82 y.o.-year-old patient lying in the bed with no acute distress. Pale, ill appearing EYES: Pupils equal, round, reactive to light and accommodation. No scleral icterus. Extraocular muscles intact.  HEENT: Head atraumatic, normocephalic. Oropharynx and nasopharynx clear. Oral mucosa pink and moist NECK:  Supple, no jugular venous distention. No thyroid enlargement, no tenderness.  LUNGS: Normal breath sounds bilaterally, no wheezing, rales,rhonchi or crepitation. No use of accessory muscles of respiration.  CARDIOVASCULAR: S1, S2 normal. No murmurs, rubs, or gallops.  ABDOMEN: Soft, nontender, nondistended. Bowel sounds present. No organomegaly or mass. No  guarding no rebound EXTREMITIES: No pedal edema, cyanosis, or clubbing. Pulses 2+ NEUROLOGIC: Cranial nerves II through XII are intact. Muscle strength 5/5 in all extremities. Sensation intact. Gait not checked.  PSYCHIATRIC: The patient is alert and oriented x 3.  SKIN: No obvious rash, lesion, or ulcer.   LABORATORY PANEL:   CBC  Recent Labs Lab 09/26/15 1411  WBC 9.5  HGB 13.2  HCT 40.7  PLT 325   ------------------------------------------------------------------------------------------------------------------  Chemistries   Recent Labs Lab 09/26/15 1411  NA 141  K 4.4  CL 106  CO2 25  GLUCOSE 124*  BUN 75*  CREATININE 2.23*  CALCIUM 9.8  AST 20  ALT 24  ALKPHOS 51  BILITOT 0.7   ------------------------------------------------------------------------------------------------------------------  Cardiac Enzymes  Recent Labs Lab 09/26/15 1721  TROPONINI <0.03   ------------------------------------------------------------------------------------------------------------------  RADIOLOGY:  Dg Chest 2 View  09/26/2015  CLINICAL DATA:  Increased weakness. EXAM: CHEST  2 VIEW COMPARISON:  Previous examinations. FINDINGS: Stable enlarged cardiac silhouette. Clear lungs with normal vascularity. Tortuous aorta. Thoracic spine and bilateral shoulder  degenerative changes. IMPRESSION: No acute abnormality.  Stable cardiomegaly. Electronically Signed   By: Claudie Revering M.D.   On: 09/26/2015 17:29   Ct Head Wo Contrast  09/26/2015  CLINICAL DATA:  82 year old female with weakness. Possible dehydration. Neck and shoulder pain increasing over the past 2 weeks. No known history of injuries. EXAM: CT HEAD WITHOUT CONTRAST CT CERVICAL SPINE WITHOUT CONTRAST TECHNIQUE: Multidetector CT imaging of the head and cervical spine was performed following the standard protocol without intravenous contrast. Multiplanar CT image reconstructions of the cervical spine were also generated.  COMPARISON:  Brain MRI 08/10/2013. FINDINGS: CT HEAD FINDINGS Patchy and confluent areas of decreased attenuation are noted throughout the deep and periventricular white matter of the cerebral hemispheres bilaterally, compatible with chronic microvascular ischemic disease. No acute intracranial abnormalities. Specifically, no evidence of acute intracranial hemorrhage, no definite findings of acute/subacute cerebral ischemia, no mass, mass effect, hydrocephalus or abnormal intra or extra-axial fluid collections. Visualized paranasal sinuses and mastoids are well pneumatized. No acute displaced skull fractures are identified. CT CERVICAL SPINE FINDINGS No acute displaced fractures of the cervical spine. Straightening of normal cervical lordosis, likely chronic. Alignment is otherwise anatomic. Retropharyngeal carotid arteries (normal anatomical variant) incidentally noted. Prevertebral soft tissues are otherwise normal. Multilevel degenerative disc disease is very severe, most evident at C3-C4, C4-C5, C5-C6 and C6-C7. Severe multilevel facet arthropathy. Visualized portions of the upper thorax demonstrate some bilateral apical nodular pleuroparenchymal thickening, presumably scarring from prior infection/inflammation. IMPRESSION: 1. No acute intracranial abnormalities. 2. No acute abnormality of the cervical spine. 3. Severe chronic microvascular ischemic changes in cerebral white matter, as above. 4. Severe multilevel degenerative disc disease and cervical spondylosis. Electronically Signed   By: Vinnie Langton M.D.   On: 09/26/2015 17:20   Ct Cervical Spine Wo Contrast  09/26/2015  CLINICAL DATA:  82 year old female with weakness. Possible dehydration. Neck and shoulder pain increasing over the past 2 weeks. No known history of injuries. EXAM: CT HEAD WITHOUT CONTRAST CT CERVICAL SPINE WITHOUT CONTRAST TECHNIQUE: Multidetector CT imaging of the head and cervical spine was performed following the standard  protocol without intravenous contrast. Multiplanar CT image reconstructions of the cervical spine were also generated. COMPARISON:  Brain MRI 08/10/2013. FINDINGS: CT HEAD FINDINGS Patchy and confluent areas of decreased attenuation are noted throughout the deep and periventricular white matter of the cerebral hemispheres bilaterally, compatible with chronic microvascular ischemic disease. No acute intracranial abnormalities. Specifically, no evidence of acute intracranial hemorrhage, no definite findings of acute/subacute cerebral ischemia, no mass, mass effect, hydrocephalus or abnormal intra or extra-axial fluid collections. Visualized paranasal sinuses and mastoids are well pneumatized. No acute displaced skull fractures are identified. CT CERVICAL SPINE FINDINGS No acute displaced fractures of the cervical spine. Straightening of normal cervical lordosis, likely chronic. Alignment is otherwise anatomic. Retropharyngeal carotid arteries (normal anatomical variant) incidentally noted. Prevertebral soft tissues are otherwise normal. Multilevel degenerative disc disease is very severe, most evident at C3-C4, C4-C5, C5-C6 and C6-C7. Severe multilevel facet arthropathy. Visualized portions of the upper thorax demonstrate some bilateral apical nodular pleuroparenchymal thickening, presumably scarring from prior infection/inflammation. IMPRESSION: 1. No acute intracranial abnormalities. 2. No acute abnormality of the cervical spine. 3. Severe chronic microvascular ischemic changes in cerebral white matter, as above. 4. Severe multilevel degenerative disc disease and cervical spondylosis. Electronically Signed   By: Vinnie Langton M.D.   On: 09/26/2015 17:20    EKG:   Orders placed or performed during the hospital encounter of 09/26/15  . ED EKG  .  ED EKG  . EKG 12-Lead  . EKG 12-Lead    IMPRESSION AND PLAN:   1. Acute renal failure: Likely prerenal azotemia due to decreased oral intake. Continue IV  fluids. Avoid nephrotoxins hold diuretics. She denies NSAID use for her neck pain. Will obtain renal ultrasound. If renal function does not improve over next 24 hours with hydration would consider nephrology consultation.  2. Acute cystitis: No hematuria dysuria. Urine with too many to count white blood cells. Urine culture is pending. Start Rocephin. This is likely the cause of her fatigue and increased dizziness. Continue ditropan  3. Chronic diastolic congestive heart failure: No exacerbation at this time. Holding Lasix due to renal failure and volume depletion. Monitor carefully as we are giving hydration  4. Hypertension: Not hypertensive at this time. Holding diuretics and lisinopril. Continue metoprolol  5. Hypothyroidism: Check TSH given recent fatigue and dizziness. Continue Synthroid  6. GERD: Continue PPI  7. Weakness we'll obtain physical therapy consultation. She uses a walker at baseline  8. Prophylaxis: Heparin for DVT prophylaxis  All the records are reviewed and case discussed with ED provider. Management plans discussed with the patient and her daughter-in-law and they are in agreement.  CODE STATUS: Full  TOTAL TIME TAKING CARE OF THIS PATIENT: 50 minutes.  Greater than 50% of time spent in coordination of care and counseling.  Myrtis Ser M.D on 09/26/2015 at 7:04 PM  Between 7am to 6pm - Pager - [phone]  After 6pm go to [email or web address] - password [email or web address] [facility]  Office  [phone]  CC: Primary care physician; Gayland Curry, MD

## 2015-09-26 NOTE — ED Provider Notes (Signed)
[facility] Emergency Department Provider Note  ____________________________________________   I have reviewed the triage vital signs and the nursing notes.   HISTORY  Chief Complaint Dehydration    HPI Amar Keenum is a 82 y.o. female with multiple different medical problems presents today complaining of feeling fatigued. According to family she has not had much to eat or drink over the last few days. Patient denies chest pain. Blood pressure was 1:30 over palp was down to 100 over palp when she stood up and she looks weak. Patient has had no fever no chills no nausea no vomiting noted diarrhea no abdominal pain no headache no chest pain. She has chronic "soreness" in her neck which is been there for several weeks but no significant change in that. She denies any focal numbness or weakness. She denies a difficulty speaking. Family states she is lightheaded and unsteady on her feet recently. The patient denies any cough. She denies any dysuria or urinary frequency. Family states over the last 2 days she's been sleeping much more than usual and they're concerned that she has become dehydrated. Patient can honestly my this might be.  Past Medical History  Diagnosis Date  . Arthritis 10 years  . Hypertension 10 years  . Heart murmur 10 years  . Thyroid disease   . Anemia   . Dizziness   . CHF (congestive heart failure) (HCC)     Dr Nehemiah Massed  . Malignant neoplasm of lower-inner quadrant of female breast ([facility]) 2012    Invasive lobular carcinoma of the left breast, LCIS. 2.2 cm. Negative sentinel node. ER/PR negative., HER-2/neu nonamplified.    Patient Active Problem List   Diagnosis Date Noted  . Choledocholithiasis 03/06/2014  . Personal history of breast cancer 05/10/2013    Past Surgical History  Procedure Laterality Date  . Upper gi endoscopy  2013  . Colonoscopy  2013    [name]  . Abdominal hysterectomy  age 29     partial  . Appendectomy  age  11  . Breast lumpectomy Left 2012    with radiation  . Gallstones removed  03/04/14    ERCP  . Eye surgery Bilateral   . Cosmetic surgery    . Ercp w/ sphicterotomy  June 2015, Verdie Shire,  MD    Follow-up ultrasound showed no residual cholelithiasis.    Current Outpatient Rx  Name  Route  Sig  Dispense  Refill  . aspirin 81 MG tablet   Oral   Take 81 mg by mouth daily.         Marland Kitchen atenolol (TENORMIN) 25 MG tablet   Oral   Take 25 mg by mouth daily.         . Cholecalciferol (VITAMIN D-3) 1000 UNITS CAPS   Oral   Take 1 capsule by mouth 3 (three) times daily.         . clobetasol cream (TEMOVATE) 0.05 %   Topical   Apply 1 application topically 2 (two) times daily.         . ferrous sulfate 325 (65 FE) MG tablet   Oral   Take 325 mg by mouth daily with breakfast.         . levothyroxine (SYNTHROID, LEVOTHROID) 75 MCG tablet   Oral   Take 75 mcg by mouth daily before breakfast.         . lisinopril (PRINIVIL,ZESTRIL) 20 MG tablet   Oral   Take 20 mg by mouth daily.          Marland Kitchen  loperamide (IMODIUM) 2 MG capsule   Oral   Take 2 mg by mouth as needed for diarrhea or loose stools.         . metoprolol succinate (TOPROL-XL) 100 MG 24 hr tablet   Oral   Take 100 mg by mouth daily. Take with or immediately following a meal.         . omeprazole (PRILOSEC) 40 MG capsule   Oral   Take 40 mg by mouth daily.         Marland Kitchen oxybutynin (DITROPAN-XL) 10 MG 24 hr tablet   Oral   Take 10 mg by mouth at bedtime.         . simvastatin (ZOCOR) 20 MG tablet   Oral   Take 20 mg by mouth daily.         . vitamin B-12 (CYANOCOBALAMIN) 1000 MCG tablet   Oral   Take 1,000 mcg by mouth daily.           Allergies Review of patient's allergies indicates no known allergies.  Family History  Problem Relation Age of Onset  . Breast cancer Maternal Grandmother 70    Social History Social History  Substance Use Topics  . Smoking status: Never Smoker   .  Smokeless tobacco: Never Used  . Alcohol Use: No    Review of Systems Constitutional: No fever/chills Eyes: No visual changes. ENT: No sore throat. No stiff neck no neck pain Cardiovascular: Denies chest pain. Respiratory: Denies shortness of breath. Gastrointestinal:   no vomiting.  No diarrhea.  No constipation. Genitourinary: Negative for dysuria. Musculoskeletal: Negative lower extremity swelling Skin: Negative for rash. Neurological: Negative for headaches, focal weakness or numbness. 10-point ROS otherwise negative.  ____________________________________________   PHYSICAL EXAM:  VITAL SIGNS: ED Triage Vitals  Enc Vitals Group     BP 09/26/15 1405 121/59 mmHg     Pulse Rate 09/26/15 1405 64     Resp 09/26/15 1405 20     Temp 09/26/15 1405 98 F (36.7 C)     Temp Source 09/26/15 1405 Oral     SpO2 09/26/15 1405 98 %     Weight 09/26/15 1405 160 lb (72.576 kg)     Height 09/26/15 1405 5' 2"  (1.575 m)     Head Cir --      Peak Flow --      Pain Score 09/26/15 1614 5     Pain Loc --      Pain Edu? --      Excl. in [place]? --     Constitutional: Alert and oriented. Well appearing and in no acute distress. Eyes: Conjunctivae are normal. PERRL. EOMI. Head: Atraumatic. Nose: No congestion/rhinnorhea. Mouth/Throat: Mucous membranes are dry.  Oropharynx non-erythematous. Neck: No stridor.  Minimal paraspinal tenderness noted with no meningismus Cardiovascular: Normal rate, regular rhythm. Grossly normal heart sounds.  Good peripheral circulation. Respiratory: Normal respiratory effort.  No retractions. Lungs CTAB. Abdominal: Soft and nontender. No distention. No guarding no rebound Back:  There is no focal tenderness or step off there is no midline tenderness there are no lesions noted. there is no CVA tenderness Musculoskeletal: No lower extremity tenderness. No joint effusions, no DVT signs strong distal pulses no edema Neurologic:  Normal speech and language. No gross  focal neurologic deficits are appreciated.  Skin:  Skin is warm, dry and intact. No rash noted. Psychiatric: Mood and affect are normal. Speech and behavior are normal.  ____________________________________________   LABS (all labs ordered are  listed, but only abnormal results are displayed)  Labs Reviewed  COMPREHENSIVE METABOLIC PANEL - Abnormal; Notable for the following:    Glucose, Bld 124 (*)    BUN 75 (*)    Creatinine, Ser 2.23 (*)    GFR calc non Af Amer 19 (*)    GFR calc Af Amer 21 (*)    All other components within normal limits  LIPASE, BLOOD  CBC  URINALYSIS COMPLETEWITH MICROSCOPIC ([facility] ONLY)  TROPONIN I   ____________________________________________  EKG  I personally interpreted any EKGs ordered by me or triage  ____________________________________________  RADIOLOGY  I reviewed any imaging ordered by me or triage that were performed during my shift ____________________________________________   PROCEDURES  Procedure(s) performed: None  Critical Care performed: None  ____________________________________________   INITIAL IMPRESSION / ASSESSMENT AND PLAN / ED COURSE  Pertinent labs & imaging results that were available during my care of the patient were reviewed by me and considered in my medical decision making (see chart for details).  Patient presents today complaining of feeling generally weak, little unsteady on her feet. No evidence of acute CVA. No focal numbness or weakness. Patient does have chronic neck and shoulder pain, CT scan does not show any evidence of occult fracture, not certain if this is anything to do with why she is not eating or drinking over the last few days. There is no vomiting. Her renal function however does show that the patient is in acute renal injury which suggests dehydration. We will give her hydration and admit her for further observation. Extensive workup in the emergency room have been reassuring thus far.  Pending urinalysis. ____________________________________________   FINAL CLINICAL IMPRESSION(S) / ED DIAGNOSES  Final diagnoses:  None     Schuyler Amor, MD 09/26/15 1746

## 2015-09-26 NOTE — ED Notes (Signed)
Sent in from md's office with poss dehydration  B/p was low in office

## 2015-09-26 NOTE — ED Notes (Signed)
Pt reports feeling more weak than normal. Pt had doctors appointment earlier and was sent to ED for possible dehydration. Pt also c/o neck and shoulder pain that has been occuring for 2 weeks.

## 2015-09-26 NOTE — ED Notes (Signed)
Pt currently in [place], to transport to floor once pt returns

## 2015-09-27 LAB — BASIC METABOLIC PANEL
Anion gap: 7 (ref 5–15)
BUN: 57 mg/dL — AB (ref 6–20)
CALCIUM: 8.4 mg/dL — AB (ref 8.9–10.3)
CO2: 22 mmol/L (ref 22–32)
Chloride: 109 mmol/L (ref 101–111)
Creatinine, Ser: 1.68 mg/dL — ABNORMAL HIGH (ref 0.44–1.00)
GFR calc Af Amer: 30 mL/min — ABNORMAL LOW (ref >60)
GFR, EST NON AFRICAN AMERICAN: 26 mL/min — AB (ref >60)
GLUCOSE: 117 mg/dL — AB (ref 65–99)
Potassium: 4.3 mmol/L (ref 3.5–5.1)
Sodium: 138 mmol/L (ref 135–145)

## 2015-09-27 LAB — BLOOD CULTURE ID PANEL (REFLEXED)
Acinetobacter baumannii: NOT DETECTED
CANDIDA KRUSEI: NOT DETECTED
CANDIDA TROPICALIS: NOT DETECTED
CARBAPENEM RESISTANCE: NOT DETECTED
Candida albicans: NOT DETECTED
Candida glabrata: NOT DETECTED
Candida parapsilosis: NOT DETECTED
Enterobacter cloacae complex: NOT DETECTED
Enterobacteriaceae species: NOT DETECTED
Enterococcus species: NOT DETECTED
Escherichia coli: NOT DETECTED
Haemophilus influenzae: NOT DETECTED
KLEBSIELLA PNEUMONIAE: NOT DETECTED
Klebsiella oxytoca: NOT DETECTED
Listeria monocytogenes: NOT DETECTED
METHICILLIN RESISTANCE: NOT DETECTED
NEISSERIA MENINGITIDIS: NOT DETECTED
PROTEUS SPECIES: NOT DETECTED
PSEUDOMONAS AERUGINOSA: NOT DETECTED
SERRATIA MARCESCENS: NOT DETECTED
STAPHYLOCOCCUS AUREUS BCID: NOT DETECTED
STAPHYLOCOCCUS SPECIES: DETECTED — AB
STREPTOCOCCUS PYOGENES: NOT DETECTED
Streptococcus agalactiae: NOT DETECTED
Streptococcus pneumoniae: NOT DETECTED
Streptococcus species: NOT DETECTED
Vancomycin resistance: NOT DETECTED

## 2015-09-27 LAB — CBC
HCT: 34.6 % — ABNORMAL LOW (ref 35.0–47.0)
Hemoglobin: 11.6 g/dL — ABNORMAL LOW (ref 12.0–16.0)
MCH: 31.8 pg (ref 26.0–34.0)
MCHC: 33.6 g/dL (ref 32.0–36.0)
MCV: 94.5 fL (ref 80.0–100.0)
PLATELETS: 278 10*3/uL (ref 150–440)
RBC: 3.66 MIL/uL — ABNORMAL LOW (ref 3.80–5.20)
RDW: 13.5 % (ref 11.5–14.5)
WBC: 7.7 10*3/uL (ref 3.6–11.0)

## 2015-09-27 MED ORDER — DEXTROSE 5 % IV SOLN
1.0000 g | INTRAVENOUS | Status: DC
  Filled 2015-09-27: qty 10

## 2015-09-27 MED ORDER — CIPROFLOXACIN HCL 250 MG PO TABS
250.0000 mg | ORAL_TABLET | Freq: Two times a day (BID) | ORAL | Status: DC
Start: 2015-09-27 — End: 2016-02-06

## 2015-09-27 NOTE — Evaluation (Signed)
Physical Therapy Evaluation Patient Details Name: Whitney Woods MRN: [id number] DOB: 01-10-1925 Today's Date: 09/27/2015   History of Present Illness  presented to ER secondary to dizziness, lethargy; admitted with acute renal failure and UTI.  Clinical Impression  Upon evaluation, patient alert and oriented; follows all commands and demonstrates fair insight/safety awareness.  Bilat UE/LE strength and ROM grossly symmetrical and WFL; denies pain at this time.  Vitals stable and WFL; no signs/symptoms of orthostasis noted with transition to upright.  Able to complete sit/stand, basic transfers and household-distance gait (29') with RW, cga; no overt buckling, LOB or safety concern noted.  Does require increased time to complete 5x sit/stand (requiring use of UEs and multiple attempts to complete lift off), indicative of decreased LE power and increased fall risk with functional activities.  Recommend continued use of RW with all mobility at this time. Would benefit from skilled PT to address above deficits and promote optimal return to PLOF; Recommend transition to [facility] upon discharge from acute hospitalization.    Follow Up Recommendations Home health PT    Equipment Recommendations       Recommendations for Other Services       Precautions / Restrictions Precautions Precautions: Fall Restrictions Weight Bearing Restrictions: No      Mobility  Bed Mobility               General bed mobility comments: seated in chair beginning/end of session  Transfers Overall transfer level: Needs assistance Equipment used: Rolling walker (2 wheeled) Transfers: Sit to/from Stand Sit to Stand: Min guard         General transfer comment: requires UE support, multiple attempts (and use of momentum) to complete  Ambulation/Gait Ambulation/Gait assistance: Min guard Ambulation Distance (Feet): 50 Feet Assistive device: Rolling walker (2 wheeled)     Gait velocity  interpretation: <1.8 ft/sec, indicative of risk for recurrent falls General Gait Details: reciprocal stepping pattern with fair step height/length, fair cadence with decreased gait speed.  Able to negoatiate turns, stationary obstacles without LOB.  Per patient/daughter, gait performance near baseline for patient.  Stairs            Wheelchair Mobility    Modified Rankin (Stroke Patients Only)       Balance Overall balance assessment: Needs assistance Sitting-balance support: No upper extremity supported;Feet supported Sitting balance-Leahy Scale: Good     Standing balance support: Bilateral upper extremity supported Standing balance-Leahy Scale: Fair                               Pertinent Vitals/Pain Pain Assessment: No/denies pain    Home Living Family/patient expects to be discharged to:: Private residence Living Arrangements: Alone Available Help at Discharge: Family Type of Home: House Home Access: Stairs to enter Entrance Stairs-Rails: Can reach both Entrance Stairs-Number of Steps: 6 Home Layout: One level Home Equipment: Walker - 4 wheels      Prior Function Level of Independence: Independent with assistive device(s)         Comments: Mod indep for household mobility, ADLs and limited community distance with [phone]; denies fall history.     Hand Dominance        Extremity/Trunk Assessment   Upper Extremity Assessment: Overall WFL for tasks assessed           Lower Extremity Assessment: Overall WFL for tasks assessed (grossly at least 4+/5 throughout; mild varus deformity R knee (denies pain).  Baseline sensory deficit bilat ankles distally.)         Communication   Communication: No difficulties  Cognition Arousal/Alertness: Awake/alert Behavior During Therapy: WFL for tasks assessed/performed Overall Cognitive Status: Within Functional Limits for tasks assessed                      General Comments       Exercises Other Exercises Other Exercises: Bilat LE seated therex, 1x10, AROM for muscular strength/endurance with functional activities Other Exercises: 5x sit/stand: 26 seconds.  Requires use of bilat UEs and multiple attempts to complete lift off from seating surfaces.      Assessment/Plan    PT Assessment Patient needs continued PT services  PT Diagnosis Difficulty walking;Generalized weakness   PT Problem List Decreased activity tolerance;Decreased balance;Decreased mobility;Decreased knowledge of use of DME;Decreased safety awareness;Decreased knowledge of precautions  PT Treatment Interventions DME instruction;Gait training;Stair training;Functional mobility training;Therapeutic activities;Therapeutic exercise;Balance training;Patient/family education   PT Goals (Current goals can be found in the Care Plan section) Acute Rehab PT Goals Patient Stated Goal: "to return home" PT Goal Formulation: With patient/family Time For Goal Achievement: 10/11/15 Potential to Achieve Goals: Good    Frequency Min 2X/week   Barriers to discharge Decreased caregiver support      Co-evaluation               End of Session Equipment Utilized During Treatment: Gait belt Activity Tolerance: Patient tolerated treatment well Patient left: in bed;with call bell/phone within reach;with chair alarm set Nurse Communication: Mobility status         Time: [id number] PT Time Calculation (min) (ACUTE ONLY): 22 min   Charges:   PT Evaluation $PT Eval Low Complexity: 1 Procedure PT Treatments $Therapeutic Exercise: 8-22 mins   PT G Codes:        Tannah Dreyfuss H. Owens Shark, PT, DPT, NCS 09/27/2015, 10:17 AM [phone]

## 2015-09-27 NOTE — Care Management Note (Addendum)
Case Management Note  Patient Details  Name: Whitney Woods MRN: [id number] Date of Birth: 11/30/1924  Subjective/Objective:                  Met with patient to discuss discharge planning. She agrees to [facility] and asks for "Whitney Woods" which she states she before for home health. She does not remember name of agency. She states she has support from her son Whitney Woods and daughter Whitney Woods. Patient is currently alert and oriented to self and situation and has arranged transportation to home today by son. She states she has a two-wheeled and 4-wheeled walker available at home. She states her PCP is with [facility] in [place]. She "only obtains meds for 90 days as she cannot afford any less due to high insurance plan". She states she "is insurance poor".   Action/Plan: List of home health agencies left with patient. Apparently Whitney Woods is Whitney Woods's wife (so DIL). I left message for her to see if she knows name of agency. Referral called to [facility]. No further RNCM needs. Case closed.  Expected Discharge Date:                  Expected Discharge Plan:     In-House Referral:     Discharge planning Services  CM Consult  Post Acute Care Choice:  Home Health Choice offered to:  Patient  DME Arranged:    DME Agency:     HH Arranged:    [place] Agency:     Status of Service:  In process, will continue to follow  Medicare Important Message Given:    Date Medicare IM Given:    Medicare IM give by:    Date Additional Medicare IM Given:    Additional Medicare Important Message give by:     If discussed at [place] of Stay Meetings, dates discussed:    Additional Comments:  Marshell Garfinkel, RN 09/27/2015, 12:29 PM

## 2015-09-27 NOTE — Progress Notes (Signed)
Patient discharged to [facility] with [facility]. Discharge & Rx instructions given to patient & daughter. IV removed & belongings packed. VSS upon discharge.

## 2015-09-27 NOTE — Discharge Summary (Signed)
[place] [facility] [place] NAME: Whitney Woods    MR#:  [id number]  DATE OF BIRTH:  09-27-1924  DATE OF ADMISSION:  09/26/2015 ADMITTING PHYSICIAN: Aldean Jewett, MD  DATE OF DISCHARGE: 09/27/2015  3:17 PM  PRIMARY CARE PHYSICIAN: ALDRIDGE,BARBARA, MD    ADMISSION DIAGNOSIS:  Dehydration [E86.0] Acute renal insufficiency [N28.9] Acute renal failure (ARF) ([place]) [N17.9]  DISCHARGE DIAGNOSIS:  Active Problems:   Acute renal failure (ARF) ([place])   SECONDARY DIAGNOSIS:   Past Medical History  Diagnosis Date  . Arthritis 10 years  . Hypertension 10 years  . Heart murmur 10 years  . Thyroid disease   . Anemia   . Dizziness   . CHF (congestive heart failure) (HCC)     Dr Nehemiah Massed  . Malignant neoplasm of lower-inner quadrant of female breast ([place]) 2012    Invasive lobular carcinoma of the left breast, LCIS. 2.2 cm. Negative sentinel node. ER/PR negative., HER-2/neu nonamplified.    HOSPITAL COURSE:   82 year old female with past medical history of hypertension, osteoarthritis, breast cancer, history of CHF, dizziness, chronic anemia who presented to the hospital due to weakness and noted to be in acute renal failure and also noted to have a urinary tract infection.  #1 acute renal failure-this was likely prerenal in nature due to poor by mouth intake. Patient was hydrated with IV fluids and her BUN and creatinine has improved and she clinically feels better. -Her inhibitor and Lasix were held and will continue to be held for 2 days postdischarge. -She should have a follow-up BUN and creatinine checked in the next week or so.  #2 urinary tract infection-this was based off the urinalysis. She is afebrile, hemodynamically stable. Her urine cultures are negative. She was on IV ceftriaxone while in the hospital and she is being discharged on oral ciprofloxacin for a few days.  #3 history of chronic diastolic CHF-she had no evidence of  decompensated CHF on the hospital. -She will resume her Lasix in the next few days after her renal function improves.  #4 hypertension-patient was maintained on oral metoprolol which she will continue. She will resume all her other antihypertensives in the next 1-2 days.  #5 hypothyroidism-patient will resume her Synthroid.  #6 hyperlipidemia-patient will resume her home dose simvastatin.    DISCHARGE CONDITIONS:   Stable  CONSULTS OBTAINED:     DRUG ALLERGIES:  No Known Allergies  DISCHARGE MEDICATIONS:   Discharge Medication List as of 09/27/2015 12:44 PM    START taking these medications   Details  ciprofloxacin (CIPRO) 250 MG tablet Take 1 tablet (250 mg total) by mouth 2 (two) times daily., Starting 09/27/2015, Until Discontinued, Print      CONTINUE these medications which have NOT CHANGED   Details  aspirin 81 MG tablet Take 81 mg by mouth daily., Until Discontinued, Historical Med    Cholecalciferol (VITAMIN D-3) 1000 UNITS CAPS Take 2 capsules by mouth daily. , Until Discontinued, Historical Med    clobetasol cream (TEMOVATE) 0.62 % Apply 1 application topically 2 (two) times daily., Until Discontinued, Historical Med    ferrous sulfate 325 (65 FE) MG tablet Take 325 mg by mouth 3 (three) times a week. , Until Discontinued, Historical Med    furosemide (LASIX) 40 MG tablet Take 40 mg by mouth daily., Starting 09/09/2015, Until Discontinued, Historical Med    gabapentin (NEURONTIN) 100 MG capsule Take 100 mg by mouth daily as needed., Until Discontinued, Historical Med  levothyroxine (SYNTHROID, LEVOTHROID) 75 MCG tablet Take 75 mcg by mouth daily before breakfast., Until Discontinued, Historical Med    lisinopril (PRINIVIL,ZESTRIL) 20 MG tablet Take 20 mg by mouth daily. , Starting 04/23/2014, Until Discontinued, Historical Med    loperamide (IMODIUM) 2 MG capsule Take 2 mg by mouth as needed for diarrhea or loose stools., Until Discontinued, Historical Med     metoprolol succinate (TOPROL-XL) 100 MG 24 hr tablet Take 25 mg by mouth 2 (two) times daily. Take with or immediately following a meal., Until Discontinued, Historical Med    omeprazole (PRILOSEC) 40 MG capsule Take 40 mg by mouth daily., Until Discontinued, Historical Med    oxybutynin (DITROPAN) 5 MG tablet Take 5 mg by mouth 2 (two) times daily., Starting 09/09/2015, Until Discontinued, Historical Med    simvastatin (ZOCOR) 20 MG tablet Take 20 mg by mouth daily., Until Discontinued, Historical Med    vitamin B-12 (CYANOCOBALAMIN) 1000 MCG tablet Take 1,000 mcg by mouth daily., Until Discontinued, Historical Med         DISCHARGE INSTRUCTIONS:   DIET:  Cardiac diet  DISCHARGE CONDITION:  Stable  ACTIVITY:  Activity as tolerated  OXYGEN:  Home Oxygen: No.   Oxygen Delivery: room air  DISCHARGE LOCATION:  home   If you experience worsening of your admission symptoms, develop shortness of breath, life threatening emergency, suicidal or homicidal thoughts you must seek medical attention immediately by calling 911 or calling your MD immediately  if symptoms less severe.  You Must read complete instructions/literature along with all the possible adverse reactions/side effects for all the Medicines you take and that have been prescribed to you. Take any new Medicines after you have completely understood and accpet all the possible adverse reactions/side effects.   Please note  You were cared for by a hospitalist during your hospital stay. If you have any questions about your discharge medications or the care you received while you were in the hospital after you are discharged, you can call the unit and asked to speak with the hospitalist on call if the hospitalist that took care of you is not available. Once you are discharged, your primary care physician will handle any further medical issues. Please note that NO REFILLS for any discharge medications will be authorized once you  are discharged, as it is imperative that you return to your primary care physician (or establish a relationship with a primary care physician if you do not have one) for your aftercare needs so that they can reassess your need for medications and monitor your lab values.     Today   Feels much better.  No complaints. Family at bedside.  Orthostatics are (-).   VITAL SIGNS:  Blood pressure 118/54, pulse 69, temperature 98.6 F (37 C), temperature source Oral, resp. rate 18, height 5' 2"  (1.575 m), weight 72.576 kg (160 lb), SpO2 95 %.  I/O:   Intake/Output Summary (Last 24 hours) at 09/27/15 1611 Last data filed at 09/27/15 1200  Gross per 24 hour  Intake    480 ml  Output      0 ml  Net    480 ml    PHYSICAL EXAMINATION:  GENERAL:  82 y.o.-year-old patient lying in the bed with no acute distress.  EYES: Pupils equal, round, reactive to light and accommodation. No scleral icterus. Extraocular muscles intact.  HEENT: Head atraumatic, normocephalic. Oropharynx and nasopharynx clear.  NECK:  Supple, no jugular venous distention. No thyroid enlargement, no tenderness.  LUNGS: Normal breath sounds bilaterally, no wheezing, rales,rhonchi. No use of accessory muscles of respiration.  CARDIOVASCULAR: S1, S2 normal. No murmurs, rubs, or gallops.  ABDOMEN: Soft, non-tender, non-distended. Bowel sounds present. No organomegaly or mass.  EXTREMITIES: No pedal edema, cyanosis, or clubbing.  NEUROLOGIC: Cranial nerves II through XII are intact. No focal motor or sensory defecits b/l.  PSYCHIATRIC: The patient is alert and oriented x 3. Good affect.  SKIN: No obvious rash, lesion, or ulcer.   DATA REVIEW:   CBC  Recent Labs Lab 09/27/15 0543  WBC 7.7  HGB 11.6*  HCT 34.6*  PLT 278    Chemistries   Recent Labs Lab 09/26/15 1411 09/27/15 0543  NA 141 138  K 4.4 4.3  CL 106 109  CO2 25 22  GLUCOSE 124* 117*  BUN 75* 57*  CREATININE 2.23* 1.68*  CALCIUM 9.8 8.4*  AST 20  --    ALT 24  --   ALKPHOS 51  --   BILITOT 0.7  --     Cardiac Enzymes  Recent Labs Lab 09/26/15 1721  TROPONINI <0.03    Microbiology Results  Results for orders placed or performed during the hospital encounter of 09/26/15  Urine culture     Status: None (Preliminary result)   Collection Time: 09/26/15  6:20 PM  Result Value Ref Range Status   Specimen Description URINE, RANDOM  Final   Special Requests NONE  Final   Culture TOO YOUNG TO READ  Final   Report Status PENDING  Incomplete  Culture, blood (Routine X 2) w Reflex to ID Panel     Status: None (Preliminary result)   Collection Time: 09/26/15  7:20 PM  Result Value Ref Range Status   Specimen Description BLOOD LEFT ANTECUBITAL  Final   Special Requests BOTTLES DRAWN AEROBIC AND ANAEROBIC  1CC  Final   Culture NO GROWTH < 12 HOURS  Final   Report Status PENDING  Incomplete  Culture, blood (Routine X 2) w Reflex to ID Panel     Status: None (Preliminary result)   Collection Time: 09/26/15  7:20 PM  Result Value Ref Range Status   Specimen Description BLOOD RIGHT ANTECUBITAL  Final   Special Requests BOTTLES DRAWN AEROBIC AND ANAEROBIC  1CC  Final   Culture NO GROWTH < 12 HOURS  Final   Report Status PENDING  Incomplete    RADIOLOGY:  Dg Chest 2 View  09/26/2015  CLINICAL DATA:  Increased weakness. EXAM: CHEST  2 VIEW COMPARISON:  Previous examinations. FINDINGS: Stable enlarged cardiac silhouette. Clear lungs with normal vascularity. Tortuous aorta. Thoracic spine and bilateral shoulder degenerative changes. IMPRESSION: No acute abnormality.  Stable cardiomegaly. Electronically Signed   By: Claudie Revering M.D.   On: 09/26/2015 17:29   Ct Head Wo Contrast  09/26/2015  CLINICAL DATA:  82 year old female with weakness. Possible dehydration. Neck and shoulder pain increasing over the past 2 weeks. No known history of injuries. EXAM: CT HEAD WITHOUT CONTRAST CT CERVICAL SPINE WITHOUT CONTRAST TECHNIQUE: Multidetector CT  imaging of the head and cervical spine was performed following the standard protocol without intravenous contrast. Multiplanar CT image reconstructions of the cervical spine were also generated. COMPARISON:  Brain MRI 08/10/2013. FINDINGS: CT HEAD FINDINGS Patchy and confluent areas of decreased attenuation are noted throughout the deep and periventricular white matter of the cerebral hemispheres bilaterally, compatible with chronic microvascular ischemic disease. No acute intracranial abnormalities. Specifically, no evidence of acute intracranial hemorrhage, no definite findings of acute/subacute  cerebral ischemia, no mass, mass effect, hydrocephalus or abnormal intra or extra-axial fluid collections. Visualized paranasal sinuses and mastoids are well pneumatized. No acute displaced skull fractures are identified. CT CERVICAL SPINE FINDINGS No acute displaced fractures of the cervical spine. Straightening of normal cervical lordosis, likely chronic. Alignment is otherwise anatomic. Retropharyngeal carotid arteries (normal anatomical variant) incidentally noted. Prevertebral soft tissues are otherwise normal. Multilevel degenerative disc disease is very severe, most evident at C3-C4, C4-C5, C5-C6 and C6-C7. Severe multilevel facet arthropathy. Visualized portions of the upper thorax demonstrate some bilateral apical nodular pleuroparenchymal thickening, presumably scarring from prior infection/inflammation. IMPRESSION: 1. No acute intracranial abnormalities. 2. No acute abnormality of the cervical spine. 3. Severe chronic microvascular ischemic changes in cerebral white matter, as above. 4. Severe multilevel degenerative disc disease and cervical spondylosis. Electronically Signed   By: Vinnie Langton M.D.   On: 09/26/2015 17:20   Ct Cervical Spine Wo Contrast  09/26/2015  CLINICAL DATA:  82 year old female with weakness. Possible dehydration. Neck and shoulder pain increasing over the past 2 weeks. No known  history of injuries. EXAM: CT HEAD WITHOUT CONTRAST CT CERVICAL SPINE WITHOUT CONTRAST TECHNIQUE: Multidetector CT imaging of the head and cervical spine was performed following the standard protocol without intravenous contrast. Multiplanar CT image reconstructions of the cervical spine were also generated. COMPARISON:  Brain MRI 08/10/2013. FINDINGS: CT HEAD FINDINGS Patchy and confluent areas of decreased attenuation are noted throughout the deep and periventricular white matter of the cerebral hemispheres bilaterally, compatible with chronic microvascular ischemic disease. No acute intracranial abnormalities. Specifically, no evidence of acute intracranial hemorrhage, no definite findings of acute/subacute cerebral ischemia, no mass, mass effect, hydrocephalus or abnormal intra or extra-axial fluid collections. Visualized paranasal sinuses and mastoids are well pneumatized. No acute displaced skull fractures are identified. CT CERVICAL SPINE FINDINGS No acute displaced fractures of the cervical spine. Straightening of normal cervical lordosis, likely chronic. Alignment is otherwise anatomic. Retropharyngeal carotid arteries (normal anatomical variant) incidentally noted. Prevertebral soft tissues are otherwise normal. Multilevel degenerative disc disease is very severe, most evident at C3-C4, C4-C5, C5-C6 and C6-C7. Severe multilevel facet arthropathy. Visualized portions of the upper thorax demonstrate some bilateral apical nodular pleuroparenchymal thickening, presumably scarring from prior infection/inflammation. IMPRESSION: 1. No acute intracranial abnormalities. 2. No acute abnormality of the cervical spine. 3. Severe chronic microvascular ischemic changes in cerebral white matter, as above. 4. Severe multilevel degenerative disc disease and cervical spondylosis. Electronically Signed   By: Vinnie Langton M.D.   On: 09/26/2015 17:20   US Renal  09/26/2015  CLINICAL DATA:  Acute onset of renal failure.   Initial encounter. EXAM: RENAL / URINARY TRACT ULTRASOUND COMPLETE COMPARISON:  MRI of the abdomen, and CT of the abdomen and pelvis performed 02/22/2014 FINDINGS: Right Kidney: Length: 10.2 cm. Echogenicity within normal limits. No mass or hydronephrosis visualized. Left Kidney: Length: 11.0 cm. Echogenicity within normal limits. Left-sided cortical thinning is noted. No mass or hydronephrosis visualized. Bladder: Appears normal for degree of bladder distention. Bilateral ureteral jets are visualized. Diffuse fatty infiltration is noted within the liver. IMPRESSION: 1. No evidence of hydronephrosis. 2. Left-sided renal cortical thinning suggests chronic renal disease. 3. Diffuse fatty infiltration within the liver. Electronically Signed   By: Garald Balding M.D.   On: 09/26/2015 20:01      Management plans discussed with the patient, family and they are in agreement.  CODE STATUS:     Code Status Orders        Start  Ordered   09/26/15 2014  Full code   Continuous     09/26/15 2013    Code Status History    Date Active Date Inactive Code Status Order ID Comments User Context   This patient has a current code status but no historical code status.      TOTAL TIME TAKING CARE OF THIS PATIENT: 40 minutes.    Henreitta Leber M.D on 09/27/2015 at 4:11 PM  Between 7am to 6pm - Pager - [phone]  After 6pm go to [email or web address] - password [email or web address] [facility]  Office  [phone]  CC: Primary care physician; Gayland Curry, MD

## 2015-09-28 LAB — URINE CULTURE

## 2015-10-01 LAB — CULTURE, BLOOD (ROUTINE X 2): CULTURE: NO GROWTH

## 2015-10-02 LAB — CULTURE, BLOOD (ROUTINE X 2)

## 2016-02-06 ENCOUNTER — Encounter: Payer: Self-pay | Admitting: General Surgery

## 2016-02-06 ENCOUNTER — Ambulatory Visit (INDEPENDENT_AMBULATORY_CARE_PROVIDER_SITE_OTHER): Payer: Medicare Other | Admitting: General Surgery

## 2016-02-06 VITALS — BP 130/78 | HR 64 | Resp 16 | Ht 66.0 in | Wt 164.0 lb

## 2016-02-06 DIAGNOSIS — R109 Unspecified abdominal pain: Secondary | ICD-10-CM | POA: Diagnosis not present

## 2016-02-06 NOTE — Progress Notes (Signed)
Patient ID: Whitney Woods, female   DOB: 08/05/1925, 82 y.o.   MRN: [id number]  Chief Complaint  Patient presents with  . Abdominal Pain    HPI Whitney Woods is a 82 y.o. female here today for a evaluation of Abdominal swelling excellent appetite, no difficulty with bowel or bladder function. She reports this has been going on since January, 2017. Moves her bowels daily.   HPI  Past Medical History  Diagnosis Date  . Arthritis 10 years  . Hypertension 10 years  . Heart murmur 10 years  . Thyroid disease   . Anemia   . Dizziness   . CHF (congestive heart failure) (HCC)     Dr Nehemiah Massed  . Malignant neoplasm of lower-inner quadrant of female breast ([place]) 2012    Invasive lobular carcinoma of the left breast, LCIS. 2.2 cm. Negative sentinel node. ER/PR negative., HER-2/neu nonamplified.    Past Surgical History  Procedure Laterality Date  . Upper gi endoscopy  2013  . Colonoscopy  2013    [name]  . Abdominal hysterectomy  age 6     partial  . Appendectomy  age 85  . Breast lumpectomy Left 2012    with radiation  . Gallstones removed  03/04/14    ERCP  . Eye surgery Bilateral   . Cosmetic surgery    . Ercp w/ sphicterotomy  June 2015, Verdie Shire,  MD    Follow-up ultrasound showed no residual cholelithiasis.    Family History  Problem Relation Age of Onset  . Breast cancer Maternal Grandmother 70    Social History Social History  Substance Use Topics  . Smoking status: Never Smoker   . Smokeless tobacco: Never Used  . Alcohol Use: No    No Known Allergies  Current Outpatient Prescriptions  Medication Sig Dispense Refill  . aspirin 81 MG tablet Take 81 mg by mouth daily.    . Cholecalciferol (VITAMIN D-3) 1000 UNITS CAPS Take 2 capsules by mouth daily.     . clobetasol cream (TEMOVATE) 6.27 % Apply 1 application topically 2 (two) times daily.    . ferrous sulfate 325 (65 FE) MG tablet Take 325 mg by mouth 3 (three) times a week.     . furosemide (LASIX)  40 MG tablet Take 40 mg by mouth daily.    Marland Kitchen levothyroxine (SYNTHROID, LEVOTHROID) 75 MCG tablet Take 75 mcg by mouth daily before breakfast.    . metoprolol succinate (TOPROL-XL) 100 MG 24 hr tablet Take 25 mg by mouth 2 (two) times daily. Take with or immediately following a meal.    . omeprazole (PRILOSEC) 40 MG capsule Take 40 mg by mouth daily.    Marland Kitchen oxybutynin (DITROPAN) 5 MG tablet Take 5 mg by mouth 2 (two) times daily.    . simvastatin (ZOCOR) 20 MG tablet Take 20 mg by mouth daily.    . vitamin B-12 (CYANOCOBALAMIN) 1000 MCG tablet Take 1,000 mcg by mouth daily.    Marland Kitchen gabapentin (NEURONTIN) 100 MG capsule Take 100 mg by mouth daily as needed. Reported on 02/06/2016    . lisinopril (PRINIVIL,ZESTRIL) 20 MG tablet Take 20 mg by mouth daily. Reported on 02/06/2016    . loperamide (IMODIUM) 2 MG capsule Take 2 mg by mouth as needed for diarrhea or loose stools. Reported on 02/06/2016     No current facility-administered medications for this visit.    Review of Systems Review of Systems  Constitutional: Negative.   Respiratory: Negative.   Cardiovascular:  Negative.     Blood pressure 130/78, pulse 64, resp. rate 16, height 5' 6"  (1.676 m), weight 164 lb (74.39 kg).  Physical Exam Physical Exam  Constitutional: She is oriented to person, place, and time. She appears well-developed and well-nourished.  Eyes: Conjunctivae are normal. No scleral icterus.  Neck: Neck supple.  Cardiovascular: Normal rate, regular rhythm and normal heart sounds.   Pulmonary/Chest: Effort normal and breath sounds normal.  Abdominal: Soft. Normal appearance and bowel sounds are normal. There is no hepatomegaly. No hernia.  Lymphadenopathy:    She has no cervical adenopathy.  Neurological: She is alert and oriented to person, place, and time.  Skin: Skin is warm and dry.    Data Reviewed PCP notes.  Assessment    No evidence of abdominal wall hernia.  Clinical history not suggestive of symptomatic  cholelithiasis.    Plan    No specific recommendations at this time.  The patient reports that she was being referred to a nephrologist at [facility]. She finds travel to that facility quite difficult. She had seen one of the [facility] area nephrologist in the past. She was encouraged to call Dr. Astrid Divine and request referral to the local center.  Patient to return as needed.    PCP:  Astrid Divine This information has been scribed by Gaspar Cola CMA.     Robert Bellow 02/07/2016, 12:58 PM

## 2016-02-06 NOTE — Patient Instructions (Signed)
Patient to return as needed. 

## 2016-02-07 DIAGNOSIS — R109 Unspecified abdominal pain: Secondary | ICD-10-CM | POA: Insufficient documentation

## 2016-03-05 ENCOUNTER — Encounter: Payer: Self-pay | Admitting: *Deleted

## 2016-04-01 ENCOUNTER — Other Ambulatory Visit: Payer: Self-pay | Admitting: General Surgery

## 2016-04-01 DIAGNOSIS — Z853 Personal history of malignant neoplasm of breast: Secondary | ICD-10-CM

## 2016-04-14 ENCOUNTER — Encounter: Payer: Self-pay | Admitting: *Deleted

## 2016-05-20 ENCOUNTER — Ambulatory Visit: Payer: Medicare Other

## 2016-05-20 ENCOUNTER — Other Ambulatory Visit: Payer: Medicare Other

## 2016-05-27 ENCOUNTER — Ambulatory Visit: Payer: Medicare Other | Admitting: General Surgery

## 2016-06-04 ENCOUNTER — Ambulatory Visit
Admission: RE | Admit: 2016-06-04 | Discharge: 2016-06-04 | Disposition: A | Discharge status: Home / Self Care | Payer: Medicare Other | Source: Ambulatory Visit | Attending: General Surgery | Admitting: General Surgery

## 2016-06-04 ENCOUNTER — Other Ambulatory Visit: Payer: Self-pay | Admitting: General Surgery

## 2016-06-04 DIAGNOSIS — Z853 Personal history of malignant neoplasm of breast: Secondary | ICD-10-CM

## 2016-06-04 DIAGNOSIS — Z9889 Other specified postprocedural states: Secondary | ICD-10-CM | POA: Insufficient documentation

## 2016-06-04 HISTORY — DX: Personal history of irradiation: Z92.3

## 2016-06-15 ENCOUNTER — Encounter: Payer: Self-pay | Admitting: *Deleted

## 2016-06-16 ENCOUNTER — Ambulatory Visit (INDEPENDENT_AMBULATORY_CARE_PROVIDER_SITE_OTHER): Payer: Medicare Other | Admitting: General Surgery

## 2016-06-16 ENCOUNTER — Encounter: Payer: Self-pay | Admitting: General Surgery

## 2016-06-16 VITALS — BP 132/78 | HR 72 | Resp 14 | Ht 66.0 in | Wt 163.0 lb

## 2016-06-16 DIAGNOSIS — Z853 Personal history of malignant neoplasm of breast: Secondary | ICD-10-CM

## 2016-06-16 NOTE — Progress Notes (Signed)
Patient ID: Whitney Woods, female   DOB: 1925-06-27, 82 y.o.   MRN: [id number]  Chief Complaint  Patient presents with  . Follow-up    mammogram    HPI Whitney Woods is a 82 y.o. female who presents for a breast evaluation. The most recent mammogram was done on 06/04/16. Patient states she has had two fells in the last month.  Patient does perform regular self breast checks and gets regular mammograms done.  Caregiver Stanton Kidney is present at visit.  The patient has experienced 2 falls in the last several months. Once while getting up in the restroom, the second time while bending over to pick up something in the kitchen. No history of trauma related to these falls.  The patient reports significant discomfort during her recent mammogram. HPI  Past Medical History:  Diagnosis Date  . Anemia   . Arthritis 10 years  . CHF (congestive heart failure) (HCC)    Dr Nehemiah Massed  . Dizziness   . Heart murmur 10 years  . Hypertension 10 years  . Malignant neoplasm of lower-inner quadrant of female breast ([place]) 2012   Invasive lobular carcinoma of the left breast, LCIS. 2.2 cm. Negative sentinel node. ER/PR negative., HER-2/neu nonamplified.  . Personal history of radiation therapy   . Thyroid disease     Past Surgical History:  Procedure Laterality Date  . ABDOMINAL HYSTERECTOMY  age 48    partial  . APPENDECTOMY  age 89  . BREAST BIOPSY Left 2012   +  . BREAST LUMPECTOMY Left 2012   with radiation  . COLONOSCOPY  2013   [name]  . COSMETIC SURGERY    . ERCP W/ SPHICTEROTOMY  June 2015, Verdie Shire,  MD   Follow-up ultrasound showed no residual cholelithiasis.  Marland Kitchen EYE SURGERY Bilateral   . gallstones removed  03/04/14   ERCP  . UPPER GI ENDOSCOPY  2013    Family History  Problem Relation Age of Onset  . Breast cancer Maternal Grandmother 70    Social History Social History  Substance Use Topics  . Smoking status: Never Smoker  . Smokeless tobacco: Never Used  . Alcohol use No     No Known Allergies  Current Outpatient Prescriptions  Medication Sig Dispense Refill  . aspirin 81 MG tablet Take 81 mg by mouth daily.    . Cholecalciferol (VITAMIN D-3) 1000 UNITS CAPS Take 2 capsules by mouth daily.     . clobetasol cream (TEMOVATE) 4.76 % Apply 1 application topically 2 (two) times daily.    . ferrous sulfate 325 (65 FE) MG tablet Take 325 mg by mouth 3 (three) times a week.     . furosemide (LASIX) 40 MG tablet Take 40 mg by mouth daily.    Marland Kitchen gabapentin (NEURONTIN) 100 MG capsule Take 100 mg by mouth daily as needed. Reported on 02/06/2016    . levothyroxine (SYNTHROID, LEVOTHROID) 75 MCG tablet Take 75 mcg by mouth daily before breakfast.    . lisinopril (PRINIVIL,ZESTRIL) 20 MG tablet Take 20 mg by mouth daily. Reported on 02/06/2016    . loperamide (IMODIUM) 2 MG capsule Take 2 mg by mouth as needed for diarrhea or loose stools. Reported on 02/06/2016    . metoprolol succinate (TOPROL-XL) 100 MG 24 hr tablet Take 25 mg by mouth 2 (two) times daily. Take with or immediately following a meal.    . omeprazole (PRILOSEC) 40 MG capsule Take 40 mg by mouth daily.    Marland Kitchen oxybutynin (  DITROPAN) 5 MG tablet Take 5 mg by mouth 2 (two) times daily.    . simvastatin (ZOCOR) 20 MG tablet Take 20 mg by mouth daily.    . vitamin B-12 (CYANOCOBALAMIN) 1000 MCG tablet Take 1,000 mcg by mouth daily.     No current facility-administered medications for this visit.     Review of Systems Review of Systems  Constitutional: Negative.   Respiratory: Negative.   Cardiovascular: Negative.     Blood pressure 132/78, pulse 72, resp. rate 14, height 5' 6"  (1.676 m), weight 163 lb (73.9 kg).  Physical Exam Physical Exam  Constitutional: She is oriented to person, place, and time. She appears well-developed and well-nourished.  Eyes: Conjunctivae are normal. No scleral icterus.  Neck: Neck supple.  Cardiovascular: Normal rate, regular rhythm and normal heart sounds.   Pulmonary/Chest:  Effort normal and breath sounds normal. Right breast exhibits no inverted nipple, no mass, no nipple discharge, no skin change and no tenderness. Left breast exhibits no inverted nipple, no mass, no nipple discharge, no skin change and no tenderness.    Abdominal: Soft. Bowel sounds are normal. There is no tenderness.  Lymphadenopathy:    She has no cervical adenopathy.    She has no axillary adenopathy.  Neurological: She is alert and oriented to person, place, and time.  Skin: Skin is warm and dry.    Data Reviewed Postural pulse and blood pressure obtained in the seated and standing position. Seeding 1:30/72, 74 pulse, 95% saturation; standing blood pressure 128/68, pulse 84, saturation 96%. No central change.  Bilateral diagnostic mammograms dated 06/04/2016 showed postsurgical changes. BI-RADS-2.  Assessment    Benign breast exam.  No evidence of postural hypotension, possible vague related with change in position.    Plan    The patient does have access to a medic alert bracelet available for should she suffer another fall.   We had a long discussion regarding the pros and cons of annual mammography. She's now 5 years out without evidence of recurrent disease. We will plan on having a clinical exam in one year, and defer mammograms less than abnormalities to determine.  Return on one year breast check. No mammogram.  This information has been scribed by Gaspar Cola CMA.  Robert Bellow 06/16/2016, 9:16 PM

## 2016-06-16 NOTE — Patient Instructions (Addendum)
Return in one year breast check., No mammogram.

## 2016-11-23 ENCOUNTER — Ambulatory Visit
Admission: RE | Admit: 2016-11-23 | Discharge: 2016-11-23 | Disposition: A | Discharge status: Home / Self Care | Payer: Medicare Other | Source: Ambulatory Visit | Attending: Family Medicine | Admitting: Family Medicine

## 2016-11-23 ENCOUNTER — Other Ambulatory Visit: Payer: Self-pay | Admitting: Family Medicine

## 2016-11-23 ENCOUNTER — Emergency Department
Admission: EM | Admit: 2016-11-23 | Discharge: 2016-11-23 | Disposition: A | Discharge status: Home / Self Care | Payer: Medicare Other | Attending: Emergency Medicine | Admitting: Emergency Medicine

## 2016-11-23 ENCOUNTER — Encounter: Payer: Self-pay | Admitting: Intensive Care

## 2016-11-23 DIAGNOSIS — I110 Hypertensive heart disease with heart failure: Secondary | ICD-10-CM | POA: Diagnosis not present

## 2016-11-23 DIAGNOSIS — K449 Diaphragmatic hernia without obstruction or gangrene: Secondary | ICD-10-CM

## 2016-11-23 DIAGNOSIS — R109 Unspecified abdominal pain: Secondary | ICD-10-CM

## 2016-11-23 DIAGNOSIS — I2510 Atherosclerotic heart disease of native coronary artery without angina pectoris: Secondary | ICD-10-CM | POA: Insufficient documentation

## 2016-11-23 DIAGNOSIS — N8189 Other female genital prolapse: Secondary | ICD-10-CM

## 2016-11-23 DIAGNOSIS — I700 Atherosclerosis of aorta: Secondary | ICD-10-CM

## 2016-11-23 DIAGNOSIS — Z5321 Procedure and treatment not carried out due to patient leaving prior to being seen by health care provider: Secondary | ICD-10-CM | POA: Diagnosis not present

## 2016-11-23 DIAGNOSIS — R110 Nausea: Secondary | ICD-10-CM | POA: Diagnosis present

## 2016-11-23 DIAGNOSIS — Z7982 Long term (current) use of aspirin: Secondary | ICD-10-CM | POA: Insufficient documentation

## 2016-11-23 DIAGNOSIS — R188 Other ascites: Secondary | ICD-10-CM

## 2016-11-23 DIAGNOSIS — Z79899 Other long term (current) drug therapy: Secondary | ICD-10-CM | POA: Insufficient documentation

## 2016-11-23 DIAGNOSIS — G8929 Other chronic pain: Secondary | ICD-10-CM | POA: Insufficient documentation

## 2016-11-23 DIAGNOSIS — I509 Heart failure, unspecified: Secondary | ICD-10-CM | POA: Insufficient documentation

## 2016-11-23 DIAGNOSIS — K760 Fatty (change of) liver, not elsewhere classified: Secondary | ICD-10-CM | POA: Insufficient documentation

## 2016-11-23 LAB — CBC
HEMATOCRIT: 35.8 % (ref 35.0–47.0)
Hemoglobin: 11.6 g/dL — ABNORMAL LOW (ref 12.0–16.0)
MCH: 28.6 pg (ref 26.0–34.0)
MCHC: 32.4 g/dL (ref 32.0–36.0)
MCV: 88.4 fL (ref 80.0–100.0)
Platelets: 444 10*3/uL — ABNORMAL HIGH (ref 150–440)
RBC: 4.05 MIL/uL (ref 3.80–5.20)
RDW: 14.3 % (ref 11.5–14.5)
WBC: 9.8 10*3/uL (ref 3.6–11.0)

## 2016-11-23 LAB — COMPREHENSIVE METABOLIC PANEL
ALBUMIN: 3.4 g/dL — AB (ref 3.5–5.0)
ALK PHOS: 44 U/L (ref 38–126)
ALT: 18 U/L (ref 14–54)
ANION GAP: 11 (ref 5–15)
AST: 23 U/L (ref 15–41)
BILIRUBIN TOTAL: 0.4 mg/dL (ref 0.3–1.2)
BUN: 68 mg/dL — AB (ref 6–20)
CO2: 20 mmol/L — AB (ref 22–32)
CREATININE: 1.52 mg/dL — AB (ref 0.44–1.00)
Calcium: 9.4 mg/dL (ref 8.9–10.3)
Chloride: 104 mmol/L (ref 101–111)
GFR calc Af Amer: 34 mL/min — ABNORMAL LOW (ref >60)
GFR calc non Af Amer: 29 mL/min — ABNORMAL LOW (ref >60)
GLUCOSE: 120 mg/dL — AB (ref 65–99)
Potassium: 4.5 mmol/L (ref 3.5–5.1)
SODIUM: 135 mmol/L (ref 135–145)
Total Protein: 7.3 g/dL (ref 6.5–8.1)

## 2016-11-23 LAB — LIPASE, BLOOD: Lipase: 16 U/L (ref 11–51)

## 2016-11-23 LAB — POCT I-STAT CREATININE: Creatinine, Ser: 2.1 mg/dL — ABNORMAL HIGH (ref 0.44–1.00)

## 2016-11-23 MED ORDER — ONDANSETRON 4 MG PO TBDP
ORAL_TABLET | ORAL | Status: AC
  Filled 2016-11-23: qty 1

## 2016-11-23 MED ORDER — ONDANSETRON 4 MG PO TBDP
4.0000 mg | ORAL_TABLET | Freq: Once | ORAL | Status: AC | PRN
  Administered 2016-11-23: 4 mg via ORAL

## 2016-11-23 NOTE — ED Notes (Addendum)
Pt placed in recliner with warm blankets in subwait for comfort

## 2016-11-23 NOTE — ED Triage Notes (Signed)
Patient presents to ER with nausea. Patient has been seen at her PCP on Friday and scheduled a CT for today for Hernia. Patient had just completed her CT scan here at [facility] and started having diarrhea. PAtients daughter wanted her to be checked. Patient denies pain at this time just nauseas.

## 2016-11-24 ENCOUNTER — Telehealth: Payer: Self-pay | Admitting: Emergency Medicine

## 2016-11-24 NOTE — Telephone Encounter (Signed)
Called daughter in law due to lwot to inquire about condition and follow up plans. She says they have already followed with [facility] and are in process of making future appts.

## 2016-12-11 ENCOUNTER — Inpatient Hospital Stay
Admission: EM | Admit: 2016-12-11 | Discharge: 2017-01-05 | DRG: 683 | Disposition: E | Payer: Medicare Other | Attending: Internal Medicine | Admitting: Internal Medicine

## 2016-12-11 ENCOUNTER — Inpatient Hospital Stay: Payer: Medicare Other | Attending: Oncology | Admitting: Oncology

## 2016-12-11 ENCOUNTER — Telehealth: Payer: Self-pay | Admitting: *Deleted

## 2016-12-11 ENCOUNTER — Inpatient Hospital Stay: Payer: Medicare Other

## 2016-12-11 ENCOUNTER — Other Ambulatory Visit: Payer: Self-pay

## 2016-12-11 ENCOUNTER — Encounter: Payer: Self-pay | Admitting: Oncology

## 2016-12-11 ENCOUNTER — Encounter: Payer: Self-pay | Admitting: Emergency Medicine

## 2016-12-11 DIAGNOSIS — Z853 Personal history of malignant neoplasm of breast: Secondary | ICD-10-CM

## 2016-12-11 DIAGNOSIS — I700 Atherosclerosis of aorta: Secondary | ICD-10-CM | POA: Insufficient documentation

## 2016-12-11 DIAGNOSIS — N183 Chronic kidney disease, stage 3 (moderate): Secondary | ICD-10-CM | POA: Diagnosis present

## 2016-12-11 DIAGNOSIS — Z79899 Other long term (current) drug therapy: Secondary | ICD-10-CM

## 2016-12-11 DIAGNOSIS — I5032 Chronic diastolic (congestive) heart failure: Secondary | ICD-10-CM | POA: Diagnosis present

## 2016-12-11 DIAGNOSIS — R011 Cardiac murmur, unspecified: Secondary | ICD-10-CM

## 2016-12-11 DIAGNOSIS — I472 Ventricular tachycardia: Secondary | ICD-10-CM | POA: Diagnosis present

## 2016-12-11 DIAGNOSIS — E875 Hyperkalemia: Secondary | ICD-10-CM | POA: Diagnosis present

## 2016-12-11 DIAGNOSIS — E079 Disorder of thyroid, unspecified: Secondary | ICD-10-CM | POA: Insufficient documentation

## 2016-12-11 DIAGNOSIS — D649 Anemia, unspecified: Secondary | ICD-10-CM | POA: Diagnosis not present

## 2016-12-11 DIAGNOSIS — I10 Essential (primary) hypertension: Secondary | ICD-10-CM | POA: Diagnosis not present

## 2016-12-11 DIAGNOSIS — I509 Heart failure, unspecified: Secondary | ICD-10-CM | POA: Insufficient documentation

## 2016-12-11 DIAGNOSIS — R971 Elevated cancer antigen 125 [CA 125]: Secondary | ICD-10-CM | POA: Insufficient documentation

## 2016-12-11 DIAGNOSIS — R42 Dizziness and giddiness: Secondary | ICD-10-CM | POA: Diagnosis not present

## 2016-12-11 DIAGNOSIS — T501X5A Adverse effect of loop [high-ceiling] diuretics, initial encounter: Secondary | ICD-10-CM | POA: Diagnosis present

## 2016-12-11 DIAGNOSIS — R0602 Shortness of breath: Secondary | ICD-10-CM | POA: Diagnosis not present

## 2016-12-11 DIAGNOSIS — C801 Malignant (primary) neoplasm, unspecified: Secondary | ICD-10-CM

## 2016-12-11 DIAGNOSIS — Z515 Encounter for palliative care: Secondary | ICD-10-CM | POA: Diagnosis present

## 2016-12-11 DIAGNOSIS — M1990 Unspecified osteoarthritis, unspecified site: Secondary | ICD-10-CM | POA: Diagnosis present

## 2016-12-11 DIAGNOSIS — M129 Arthropathy, unspecified: Secondary | ICD-10-CM | POA: Diagnosis not present

## 2016-12-11 DIAGNOSIS — F419 Anxiety disorder, unspecified: Secondary | ICD-10-CM | POA: Diagnosis present

## 2016-12-11 DIAGNOSIS — E559 Vitamin D deficiency, unspecified: Secondary | ICD-10-CM | POA: Diagnosis present

## 2016-12-11 DIAGNOSIS — I2510 Atherosclerotic heart disease of native coronary artery without angina pectoris: Secondary | ICD-10-CM | POA: Insufficient documentation

## 2016-12-11 DIAGNOSIS — R32 Unspecified urinary incontinence: Secondary | ICD-10-CM | POA: Diagnosis present

## 2016-12-11 DIAGNOSIS — T464X5A Adverse effect of angiotensin-converting-enzyme inhibitors, initial encounter: Secondary | ICD-10-CM | POA: Diagnosis present

## 2016-12-11 DIAGNOSIS — Z923 Personal history of irradiation: Secondary | ICD-10-CM

## 2016-12-11 DIAGNOSIS — C569 Malignant neoplasm of unspecified ovary: Secondary | ICD-10-CM | POA: Diagnosis present

## 2016-12-11 DIAGNOSIS — N179 Acute kidney failure, unspecified: Principal | ICD-10-CM | POA: Diagnosis present

## 2016-12-11 DIAGNOSIS — K668 Other specified disorders of peritoneum: Secondary | ICD-10-CM

## 2016-12-11 DIAGNOSIS — I130 Hypertensive heart and chronic kidney disease with heart failure and stage 1 through stage 4 chronic kidney disease, or unspecified chronic kidney disease: Secondary | ICD-10-CM | POA: Diagnosis present

## 2016-12-11 DIAGNOSIS — R188 Other ascites: Secondary | ICD-10-CM | POA: Insufficient documentation

## 2016-12-11 DIAGNOSIS — Z7189 Other specified counseling: Secondary | ICD-10-CM

## 2016-12-11 DIAGNOSIS — Z7982 Long term (current) use of aspirin: Secondary | ICD-10-CM

## 2016-12-11 DIAGNOSIS — E039 Hypothyroidism, unspecified: Secondary | ICD-10-CM | POA: Diagnosis present

## 2016-12-11 DIAGNOSIS — R350 Frequency of micturition: Secondary | ICD-10-CM | POA: Diagnosis present

## 2016-12-11 DIAGNOSIS — I440 Atrioventricular block, first degree: Secondary | ICD-10-CM | POA: Diagnosis present

## 2016-12-11 DIAGNOSIS — K760 Fatty (change of) liver, not elsewhere classified: Secondary | ICD-10-CM | POA: Insufficient documentation

## 2016-12-11 DIAGNOSIS — D631 Anemia in chronic kidney disease: Secondary | ICD-10-CM | POA: Diagnosis present

## 2016-12-11 DIAGNOSIS — C786 Secondary malignant neoplasm of retroperitoneum and peritoneum: Secondary | ICD-10-CM

## 2016-12-11 DIAGNOSIS — R531 Weakness: Secondary | ICD-10-CM | POA: Diagnosis not present

## 2016-12-11 DIAGNOSIS — Z66 Do not resuscitate: Secondary | ICD-10-CM | POA: Diagnosis present

## 2016-12-11 DIAGNOSIS — E860 Dehydration: Secondary | ICD-10-CM | POA: Diagnosis present

## 2016-12-11 DIAGNOSIS — R5383 Other fatigue: Secondary | ICD-10-CM | POA: Diagnosis not present

## 2016-12-11 DIAGNOSIS — K449 Diaphragmatic hernia without obstruction or gangrene: Secondary | ICD-10-CM | POA: Insufficient documentation

## 2016-12-11 DIAGNOSIS — E785 Hyperlipidemia, unspecified: Secondary | ICD-10-CM | POA: Diagnosis present

## 2016-12-11 DIAGNOSIS — Z992 Dependence on renal dialysis: Secondary | ICD-10-CM | POA: Diagnosis not present

## 2016-12-11 DIAGNOSIS — Z5329 Procedure and treatment not carried out because of patient's decision for other reasons: Secondary | ICD-10-CM | POA: Diagnosis present

## 2016-12-11 DIAGNOSIS — Z803 Family history of malignant neoplasm of breast: Secondary | ICD-10-CM

## 2016-12-11 DIAGNOSIS — R109 Unspecified abdominal pain: Secondary | ICD-10-CM

## 2016-12-11 DIAGNOSIS — R140 Abdominal distension (gaseous): Secondary | ICD-10-CM | POA: Diagnosis present

## 2016-12-11 DIAGNOSIS — K8050 Calculus of bile duct without cholangitis or cholecystitis without obstruction: Secondary | ICD-10-CM | POA: Diagnosis not present

## 2016-12-11 LAB — CBC WITH DIFFERENTIAL/PLATELET
Basophils Absolute: 0 10*3/uL (ref 0–0.1)
Basophils Absolute: 0.1 10*3/uL (ref 0–0.1)
Basophils Relative: 1 %
Basophils Relative: 1 %
EOS ABS: 0.2 10*3/uL (ref 0–0.7)
Eosinophils Absolute: 0.2 10*3/uL (ref 0–0.7)
Eosinophils Relative: 3 %
Eosinophils Relative: 2 %
HCT: 33.8 % — ABNORMAL LOW (ref 35.0–47.0)
HEMATOCRIT: 35.2 % (ref 35.0–47.0)
HEMOGLOBIN: 11.1 g/dL — AB (ref 12.0–16.0)
Hemoglobin: 10.8 g/dL — ABNORMAL LOW (ref 12.0–16.0)
LYMPHS PCT: 13 %
Lymphocytes Relative: 12 %
Lymphs Abs: 1 10*3/uL (ref 1.0–3.6)
Lymphs Abs: 0.9 10*3/uL — ABNORMAL LOW (ref 1.0–3.6)
MCH: 27.8 pg (ref 26.0–34.0)
MCH: 28.1 pg (ref 26.0–34.0)
MCHC: 31.6 g/dL — AB (ref 32.0–36.0)
MCHC: 32 g/dL (ref 32.0–36.0)
MCV: 88.1 fL (ref 80.0–100.0)
MCV: 87.9 fL (ref 80.0–100.0)
MONO ABS: 0.7 10*3/uL (ref 0.2–0.9)
MONOS PCT: 12 %
Monocytes Absolute: 0.9 10*3/uL (ref 0.2–0.9)
Monocytes Relative: 10 %
NEUTROS ABS: 5.3 10*3/uL (ref 1.4–6.5)
NEUTROS PCT: 71 %
NEUTROS PCT: 75 %
Neutro Abs: 5.6 10*3/uL (ref 1.4–6.5)
Platelets: 477 10*3/uL — ABNORMAL HIGH (ref 150–440)
Platelets: 443 10*3/uL — ABNORMAL HIGH (ref 150–440)
RBC: 3.99 MIL/uL (ref 3.80–5.20)
RBC: 3.85 MIL/uL (ref 3.80–5.20)
RDW: 14.6 % — ABNORMAL HIGH (ref 11.5–14.5)
RDW: 14.8 % — AB (ref 11.5–14.5)
WBC: 7.8 10*3/uL (ref 3.6–11.0)
WBC: 7.1 10*3/uL (ref 3.6–11.0)

## 2016-12-11 LAB — COMPREHENSIVE METABOLIC PANEL
ALK PHOS: 56 U/L (ref 38–126)
ALT: 15 U/L (ref 14–54)
ALT: 13 U/L — AB (ref 14–54)
ANION GAP: 8 (ref 5–15)
AST: 18 U/L (ref 15–41)
AST: 18 U/L (ref 15–41)
Albumin: 3 g/dL — ABNORMAL LOW (ref 3.5–5.0)
Albumin: 2.9 g/dL — ABNORMAL LOW (ref 3.5–5.0)
Alkaline Phosphatase: 51 U/L (ref 38–126)
Anion gap: 10 (ref 5–15)
BILIRUBIN TOTAL: 0.3 mg/dL (ref 0.3–1.2)
BUN: 76 mg/dL — ABNORMAL HIGH (ref 6–20)
BUN: 97 mg/dL — ABNORMAL HIGH (ref 6–20)
CALCIUM: 9.2 mg/dL (ref 8.9–10.3)
CALCIUM: 9 mg/dL (ref 8.9–10.3)
CHLORIDE: 112 mmol/L — AB (ref 101–111)
CO2: 18 mmol/L — ABNORMAL LOW (ref 22–32)
CO2: 15 mmol/L — ABNORMAL LOW (ref 22–32)
CREATININE: 3.6 mg/dL — AB (ref 0.44–1.00)
CREATININE: 3.68 mg/dL — AB (ref 0.44–1.00)
Chloride: 109 mmol/L (ref 101–111)
GFR, EST AFRICAN AMERICAN: 12 mL/min — AB (ref >60)
GFR, EST AFRICAN AMERICAN: 12 mL/min — AB (ref >60)
GFR, EST NON AFRICAN AMERICAN: 10 mL/min — AB (ref >60)
GFR, EST NON AFRICAN AMERICAN: 10 mL/min — AB (ref >60)
Glucose, Bld: 129 mg/dL — ABNORMAL HIGH (ref 65–99)
Glucose, Bld: 134 mg/dL — ABNORMAL HIGH (ref 65–99)
Potassium: 7 mmol/L (ref 3.5–5.1)
Potassium: 6.6 mmol/L (ref 3.5–5.1)
SODIUM: 135 mmol/L (ref 135–145)
Sodium: 137 mmol/L (ref 135–145)
TOTAL PROTEIN: 6.9 g/dL (ref 6.5–8.1)
Total Bilirubin: 0.5 mg/dL (ref 0.3–1.2)
Total Protein: 7.2 g/dL (ref 6.5–8.1)

## 2016-12-11 LAB — BASIC METABOLIC PANEL
ANION GAP: 9 (ref 5–15)
BUN: 96 mg/dL — ABNORMAL HIGH (ref 6–20)
CALCIUM: 10.1 mg/dL (ref 8.9–10.3)
CO2: 18 mmol/L — AB (ref 22–32)
Chloride: 112 mmol/L — ABNORMAL HIGH (ref 101–111)
Creatinine, Ser: 3.78 mg/dL — ABNORMAL HIGH (ref 0.44–1.00)
GFR calc Af Amer: 11 mL/min — ABNORMAL LOW (ref >60)
GFR, EST NON AFRICAN AMERICAN: 10 mL/min — AB (ref >60)
Glucose, Bld: 117 mg/dL — ABNORMAL HIGH (ref 65–99)
POTASSIUM: 5.9 mmol/L — AB (ref 3.5–5.1)
Sodium: 139 mmol/L (ref 135–145)

## 2016-12-11 LAB — PROTIME-INR
INR: 1.04
Prothrombin Time: 13.6 seconds (ref 11.4–15.2)

## 2016-12-11 LAB — POTASSIUM: POTASSIUM: 4.8 mmol/L (ref 3.5–5.1)

## 2016-12-11 LAB — GLUCOSE, CAPILLARY: Glucose-Capillary: 228 mg/dL — ABNORMAL HIGH (ref 65–99)

## 2016-12-11 LAB — APTT: APTT: 24 s (ref 24–36)

## 2016-12-11 LAB — PHOSPHORUS: Phosphorus: 4.2 mg/dL (ref 2.5–4.6)

## 2016-12-11 MED ORDER — INSULIN ASPART 100 UNIT/ML ~~LOC~~ SOLN
SUBCUTANEOUS | Status: AC
  Administered 2016-12-11: 10 [IU] via INTRAVENOUS
  Filled 2016-12-11: qty 10

## 2016-12-11 MED ORDER — SODIUM BICARBONATE 8.4 % IV SOLN
INTRAVENOUS | Status: AC
  Administered 2016-12-11: 50 meq via INTRAVENOUS
  Filled 2016-12-11: qty 50

## 2016-12-11 MED ORDER — ALBUTEROL SULFATE (2.5 MG/3ML) 0.083% IN NEBU
2.5000 mg | INHALATION_SOLUTION | Freq: Once | RESPIRATORY_TRACT | Status: AC
  Administered 2016-12-11: 2.5 mg via RESPIRATORY_TRACT
  Filled 2016-12-11: qty 3

## 2016-12-11 MED ORDER — HEPARIN SODIUM (PORCINE) 5000 UNIT/ML IJ SOLN
INTRAMUSCULAR | Status: AC
  Filled 2016-12-11: qty 1

## 2016-12-11 MED ORDER — SODIUM BICARBONATE 8.4 % IV SOLN
50.0000 meq | Freq: Once | INTRAVENOUS | Status: AC
  Administered 2016-12-11: 50 meq via INTRAVENOUS

## 2016-12-11 MED ORDER — LIDOCAINE-PRILOCAINE 2.5-2.5 % EX CREA: 1.0000 "application " | TOPICAL_CREAM | CUTANEOUS | Status: DC | PRN

## 2016-12-11 MED ORDER — INSULIN ASPART 100 UNIT/ML ~~LOC~~ SOLN
10.0000 [IU] | Freq: Once | SUBCUTANEOUS | Status: AC
  Administered 2016-12-11: 10 [IU] via INTRAVENOUS

## 2016-12-11 MED ORDER — CALCIUM CHLORIDE 10 % IV SOLN
1.0000 g | Freq: Once | INTRAVENOUS | Status: AC
  Administered 2016-12-11: 1 g via INTRAVENOUS

## 2016-12-11 MED ORDER — HEPARIN SODIUM (PORCINE) 1000 UNIT/ML IJ SOLN
5000.0000 [IU] | Freq: Once | INTRAMUSCULAR | Status: AC
  Administered 2016-12-11: 5000 [IU]

## 2016-12-11 MED ORDER — MORPHINE SULFATE (PF) 2 MG/ML IV SOLN
2.0000 mg | Freq: Once | INTRAVENOUS | Status: AC
  Administered 2016-12-11: 2 mg via INTRAVENOUS
  Filled 2016-12-11: qty 1

## 2016-12-11 MED ORDER — VITAMIN B-12 1000 MCG PO TABS
1000.0000 ug | ORAL_TABLET | Freq: Every day | ORAL | Status: DC
  Administered 2016-12-12 – 2016-12-15 (×4): 1000 ug via ORAL
  Filled 2016-12-11 (×4): qty 1

## 2016-12-11 MED ORDER — SODIUM CHLORIDE 0.9 % IV SOLN
INTRAVENOUS | Status: DC
  Administered 2016-12-11 – 2016-12-14 (×6): via INTRAVENOUS

## 2016-12-11 MED ORDER — SODIUM CHLORIDE 0.9 % IV BOLUS (SEPSIS): 1000.0000 mL | Freq: Once | INTRAVENOUS | Status: DC

## 2016-12-11 MED ORDER — LIDOCAINE HCL (PF) 1 % IJ SOLN
INTRAMUSCULAR | Status: AC
  Filled 2016-12-11: qty 10

## 2016-12-11 MED ORDER — SODIUM CHLORIDE 0.9 % IV SOLN: 100.0000 mL | INTRAVENOUS | Status: DC | PRN

## 2016-12-11 MED ORDER — CALCIUM GLUCONATE 10 % IV SOLN
INTRAVENOUS | Status: AC
  Filled 2016-12-11: qty 10

## 2016-12-11 MED ORDER — PANTOPRAZOLE SODIUM 40 MG PO TBEC
40.0000 mg | DELAYED_RELEASE_TABLET | Freq: Every day | ORAL | Status: DC
  Administered 2016-12-12 – 2016-12-15 (×4): 40 mg via ORAL
  Filled 2016-12-11 (×4): qty 1

## 2016-12-11 MED ORDER — ASPIRIN 81 MG PO CHEW
81.0000 mg | CHEWABLE_TABLET | Freq: Every day | ORAL | Status: DC
  Administered 2016-12-12: 81 mg via ORAL
  Filled 2016-12-11: qty 1

## 2016-12-11 MED ORDER — HEPARIN SODIUM (PORCINE) 1000 UNIT/ML DIALYSIS
1000.0000 [IU] | INTRAMUSCULAR | Status: DC | PRN
  Filled 2016-12-11: qty 1

## 2016-12-11 MED ORDER — HEPARIN SODIUM (PORCINE) 5000 UNIT/ML IJ SOLN
5000.0000 [IU] | Freq: Three times a day (TID) | INTRAMUSCULAR | Status: DC
  Filled 2016-12-11 (×3): qty 1

## 2016-12-11 MED ORDER — CALCIUM CHLORIDE 10 % IV SOLN: 1.0000 g | Freq: Once | INTRAVENOUS | Status: DC

## 2016-12-11 MED ORDER — OXYCODONE-ACETAMINOPHEN 5-325 MG PO TABS
1.0000 | ORAL_TABLET | Freq: Three times a day (TID) | ORAL | Status: DC | PRN
  Administered 2016-12-11 – 2016-12-14 (×4): 1 via ORAL
  Filled 2016-12-11 (×4): qty 1

## 2016-12-11 MED ORDER — PENTAFLUOROPROP-TETRAFLUOROETH EX AERO
1.0000 | INHALATION_SPRAY | CUTANEOUS | Status: DC | PRN
Start: 2016-12-11 — End: 2016-12-12

## 2016-12-11 MED ORDER — SODIUM CHLORIDE 0.9 % IV SOLN: 1.0000 g | Freq: Once | INTRAVENOUS | Status: DC

## 2016-12-11 MED ORDER — CALCIUM GLUCONATE 10 % IV SOLN
1.0000 g | Freq: Once | INTRAVENOUS | Status: DC
  Filled 2016-12-11: qty 10

## 2016-12-11 MED ORDER — ALTEPLASE 2 MG IJ SOLR: 2.0000 mg | Freq: Once | INTRAMUSCULAR | Status: DC | PRN

## 2016-12-11 MED ORDER — SODIUM CHLORIDE 0.9 % IV SOLN: 2.0000 g | Freq: Once | INTRAVENOUS | Status: DC

## 2016-12-11 MED ORDER — FERROUS SULFATE 325 (65 FE) MG PO TABS
325.0000 mg | ORAL_TABLET | ORAL | Status: DC
Start: 2016-12-11 — End: 2016-12-15
  Administered 2016-12-14: 325 mg via ORAL
  Filled 2016-12-11: qty 1

## 2016-12-11 MED ORDER — DEXTROSE 50 % IV SOLN
1.0000 | Freq: Once | INTRAVENOUS | Status: AC
  Administered 2016-12-11: 50 mL via INTRAVENOUS

## 2016-12-11 MED ORDER — LEVOTHYROXINE SODIUM 75 MCG PO TABS
75.0000 ug | ORAL_TABLET | Freq: Every day | ORAL | Status: DC
  Administered 2016-12-12 – 2016-12-15 (×4): 75 ug via ORAL
  Filled 2016-12-11 (×4): qty 1

## 2016-12-11 MED ORDER — DIPHENHYDRAMINE HCL 25 MG PO CAPS
25.0000 mg | ORAL_CAPSULE | Freq: Every evening | ORAL | Status: DC | PRN
  Administered 2016-12-11: 25 mg via ORAL
  Filled 2016-12-11: qty 1

## 2016-12-11 MED ORDER — LIDOCAINE HCL (PF) 1 % IJ SOLN
5.0000 mL | Freq: Once | INTRAMUSCULAR | Status: AC
  Administered 2016-12-11: 5 mL via INTRADERMAL

## 2016-12-11 MED ORDER — ONDANSETRON HCL 4 MG/2ML IJ SOLN: 4.0000 mg | Freq: Four times a day (QID) | INTRAMUSCULAR | Status: DC | PRN

## 2016-12-11 MED ORDER — DEXTROSE 50 % IV SOLN
INTRAVENOUS | Status: AC
  Administered 2016-12-11: 50 mL via INTRAVENOUS
  Filled 2016-12-11: qty 50

## 2016-12-11 MED ORDER — METOPROLOL SUCCINATE ER 25 MG PO TB24
25.0000 mg | ORAL_TABLET | Freq: Two times a day (BID) | ORAL | Status: DC
  Administered 2016-12-11 – 2016-12-15 (×8): 25 mg via ORAL
  Filled 2016-12-11 (×9): qty 1

## 2016-12-11 MED ORDER — SODIUM CHLORIDE 0.9 % IV SOLN
2.0000 g | Freq: Once | INTRAVENOUS | Status: DC
  Filled 2016-12-11: qty 20

## 2016-12-11 MED ORDER — OXYBUTYNIN CHLORIDE 5 MG PO TABS
5.0000 mg | ORAL_TABLET | Freq: Two times a day (BID) | ORAL | Status: DC
  Administered 2016-12-11 – 2016-12-15 (×9): 5 mg via ORAL
  Filled 2016-12-11 (×9): qty 1

## 2016-12-11 MED ORDER — SODIUM POLYSTYRENE SULFONATE 15 GM/60ML PO SUSP
ORAL | Status: AC
  Filled 2016-12-11: qty 120

## 2016-12-11 MED ORDER — ONDANSETRON HCL 4 MG PO TABS: 4.0000 mg | ORAL_TABLET | Freq: Four times a day (QID) | ORAL | Status: DC | PRN

## 2016-12-11 MED ORDER — LIDOCAINE HCL (PF) 1 % IJ SOLN
5.0000 mL | INTRAMUSCULAR | Status: DC | PRN
  Filled 2016-12-11: qty 5

## 2016-12-11 MED ORDER — DOCUSATE SODIUM 100 MG PO CAPS
100.0000 mg | ORAL_CAPSULE | Freq: Two times a day (BID) | ORAL | Status: DC
  Administered 2016-12-11: 100 mg via ORAL
  Filled 2016-12-11: qty 1

## 2016-12-11 NOTE — Progress Notes (Signed)
Pre dialysis assessment 

## 2016-12-11 NOTE — ED Triage Notes (Signed)
Pt seen at cancer center today. Dr. Hulen Skains pt and told her that her lab values were abnormal and that she needed to come to the ED for evaluation. Pt currently in NAD

## 2016-12-11 NOTE — ED Notes (Signed)
Vascular surgeon to bedside at this time.

## 2016-12-11 NOTE — H&P (Signed)
[facility] NAME: Whitney Woods    MR#:  [id number]  DATE OF BIRTH:  1924/10/11  DATE OF ADMISSION:  12/28/2016  PRIMARY CARE PHYSICIAN: Whitney Curry, MD   REQUESTING/REFERRING PHYSICIAN: [place]   Chief Complaint  Patient presents with  . hyperkalemia    HISTORY OF PRESENT ILLNESS:  Whitney Woods  is a 83 y.o. female with a known history of Essential hypertension, and he died. Return in the carcinomatosis by CAT scan of abdomen went to see Whitney. Grayland Woods for the first time, patient had lab work done which showed potassium of 7 so she was sent to the emergency room. Patient has been having some nausea for the past 1 week, decreased by mouth intake and also had episodes of diarrhea on and off for the last few weeks. Complains of abdominal pain as well.  PAST MEDICAL HISTORY:   Past Medical History:  Diagnosis Date  . Anemia   . Arthritis 10 years  . CHF (congestive heart failure) (HCC)    Whitney Woods  . Dizziness   . Heart murmur 10 years  . Hypertension 10 years  . Malignant neoplasm of lower-inner quadrant of female breast ([place]) 2012   Invasive lobular carcinoma of the left breast, LCIS. 2.2 cm. Negative sentinel node. ER/PR negative., HER-2/neu nonamplified.  . Personal history of radiation therapy   . Thyroid disease     PAST SURGICAL HISTOIRY:   Past Surgical History:  Procedure Laterality Date  . ABDOMINAL HYSTERECTOMY  age 71    partial  . APPENDECTOMY  age 67  . BREAST BIOPSY Left 2012   +  . BREAST LUMPECTOMY Left 2012   with radiation  . COLONOSCOPY  2013   [name]  . COSMETIC SURGERY    . ERCP W/ SPHICTEROTOMY  June 2015, Verdie Shire,  MD   Follow-up ultrasound showed no residual cholelithiasis.  Marland Kitchen EYE SURGERY Bilateral   . gallstones removed  03/04/14   ERCP  . UPPER GI ENDOSCOPY  2013    SOCIAL HISTORY:   Social History  Substance Use Topics  . Smoking status:  Never Smoker  . Smokeless tobacco: Never Used  . Alcohol use No    FAMILY HISTORY:   Family History  Problem Relation Age of Onset  . Breast cancer Maternal Grandmother 6  . Breast cancer Mother   . Diabetes Mother   . Heart disease Mother   . Stroke Father     DRUG ALLERGIES:  No Known Allergies  REVIEW OF SYSTEMS:  CONSTITUTIONAL: Fatigue.  EYES: No blurred or double vision.  EARS, NOSE, AND THROAT: No tinnitus or ear pain.  RESPIRATORY: No cough, shortness of breath, wheezing or hemoptysis.  CARDIOVASCULAR: No chest pain, orthopnea, edema.  GASTROINTESTINAL: Nausea, diarrhea, abdominal pain, decreased appetite.  GENITOURINARY: No dysuria, hematuria.  ENDOCRINE: No polyuria, nocturia,  HEMATOLOGY: No anemia, easy bruising or bleeding SKIN: No rash or lesion. MUSCULOSKELETAL: No joint pain or arthritis.   NEUROLOGIC: No tingling, numbness, weakness.  PSYCHIATRY: No anxiety or depression.   MEDICATIONS AT HOME:   Prior to Admission medications   Medication Sig Start Date End Date Taking? Authorizing Provider  aspirin 81 MG tablet Take 81 mg by mouth daily.    Historical Provider, MD  Cholecalciferol (VITAMIN D-3) 1000 UNITS CAPS Take 2 capsules by mouth daily.     Historical Provider, MD  clobetasol cream (TEMOVATE) 9.19 % Apply 1 application topically 2 (two)  times daily.    Historical Provider, MD  ferrous sulfate 325 (65 FE) MG tablet Take 325 mg by mouth 3 (three) times a week.     Historical Provider, MD  furosemide (LASIX) 40 MG tablet Take 40 mg by mouth daily. 09/09/15   Historical Provider, MD  gabapentin (NEURONTIN) 100 MG capsule Take 100 mg by mouth daily as needed. Reported on 02/06/2016    Historical Provider, MD  levothyroxine (SYNTHROID, LEVOTHROID) 75 MCG tablet Take 75 mcg by mouth daily before breakfast.    Historical Provider, MD  lisinopril (PRINIVIL,ZESTRIL) 20 MG tablet Take 20 mg by mouth daily. Reported on 02/06/2016 04/23/14   Historical Provider, MD   loperamide (IMODIUM) 2 MG capsule Take 2 mg by mouth as needed for diarrhea or loose stools. Reported on 02/06/2016    Historical Provider, MD  metoprolol succinate (TOPROL-XL) 100 MG 24 hr tablet Take 25 mg by mouth 2 (two) times daily. Take with or immediately following a meal.    Historical Provider, MD  omeprazole (PRILOSEC) 40 MG capsule Take 40 mg by mouth daily.    Historical Provider, MD  oxybutynin (DITROPAN) 5 MG tablet Take 5 mg by mouth 2 (two) times daily. 09/09/15   Historical Provider, MD  simvastatin (ZOCOR) 20 MG tablet Take 20 mg by mouth daily.    Historical Provider, MD  vitamin B-12 (CYANOCOBALAMIN) 1000 MCG tablet Take 1,000 mcg by mouth daily.    Historical Provider, MD      VITAL SIGNS:  Blood pressure 124/60, pulse (!) 106, temperature 98.4 F (36.9 C), temperature source Oral, resp. rate 16, weight 74.8 kg (165 lb), SpO2 96 %.  PHYSICAL EXAMINATION:  GENERAL:  83 y.o.-year-old patient lying in the bed with no acute distress.  EYES: Pupils equal, round, reactive to light and accommodation. No scleral icterus. Extraocular muscles intact.  HEENT: Head atraumatic, normocephalic. Oropharynx and nasopharynx clear.  NECK:  Supple, no jugular venous distention. No thyroid enlargement, no tenderness.  LUNGS: Normal breath sounds bilaterally, no wheezing, rales,rhonchi or crepitation. No use of accessory muscles of respiration.  CARDIOVASCULAR: S1, S2 normal. No murmurs, rubs, or gallops.  ABDOMEN: Soft, nontender, distended  with ascites.. Bowel sounds present. No organomegaly or mass.  EXTREMITIES:  trace pedal edema, cyanosis, or clubbing.  NEUROLOGIC: Cranial nerves II through XII are intact. Muscle strength 5/5 in all extremities. Sensation intact. Gait not checked.  PSYCHIATRIC: The patient is alert and oriented x 3.  SKIN: No obvious rash, lesion, or ulcer.   LABORATORY PANEL:   CBC  Recent Labs Lab 01/03/2017 1723  WBC 7.1  HGB 10.8*  HCT 33.8*  PLT 443*    ------------------------------------------------------------------------------------------------------------------  Chemistries   Recent Labs Lab 12/26/2016 1723  NA 137  K 6.6*  CL 112*  CO2 15*  GLUCOSE 134*  BUN 97*  CREATININE 3.68*  CALCIUM 9.0  AST 18  ALT 13*  ALKPHOS 51  BILITOT 0.5   ------------------------------------------------------------------------------------------------------------------  Cardiac Enzymes No results for input(s): TROPONINI in the last 168 hours. ------------------------------------------------------------------------------------------------------------------  RADIOLOGY:  No results found.  EKG:   Orders placed or performed during the hospital encounter of 12/20/2016  . ED EKG  . ED EKG  EKG shows  t wave inverwsion  In lead 3,flatened t wave v4,v5  IMPRESSION AND PLAN:  1 .life-threatening hyperkalemia and EKG changes: Admitted to telemetry, patient received potassium shifting measures, nephrology consulted for emergency hemodialysis, vascular consulted for dialysis access, patient agreed for dialysis. #2 acute on chronic renal  failure with CK D stage 3'; acute kidney injury worsened by poor by mouth intake, dehydration,; on gentle hydration. 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 #4 essential hypertension, tachycardia: Monitor on telemetry, patient is on Toprol-XL  #4 history of chronic diastolic heart failure:hold lasix   And lisinopril because of acute kidney injury Prognosis is poor D/w son,d/w Whitney.Lateef   All the records are reviewed and case discussed with ED provider. Management plans discussed with the patient, family and they are in agreement.  CODE STATUS: full  TOTAL TIME TAKING CARE OF THIS PATIENT: 60mnutes.    KEpifanio Lesches M.D on 12/10/2016 at 6:21 PM  Between 7am to 6pm - Pager - [phone]  After 6pm go to [email or web address] - password [email or web address]  Office  [phone] CC: Primary care physician; AGayland Curry MD  Note: This dictation was prepared with Dragon dictation along with smaller phrase technology. Any transcriptional errors that result from this process are unintentional.

## 2016-12-11 NOTE — Progress Notes (Signed)
[place]  [phone] [phone]  ID: Whitney Woods OB: [birth year]  MR#: [id number]  NAT#:[id number]  Patient Care Team: Gayland Curry, MD as PCP - General (Family Medicine) Robert Bellow, MD (General Surgery) Gayland Curry, MD as Consulting Physician (Family Medicine) Evaristo Bury, MD as Referring Physician (Vascular Surgery) Anthonette Legato, MD (Internal Medicine)  CHIEF COMPLAINT: Peritoneal carcinomatosis.  INTERVAL HISTORY: Patient is an 83 year old female who noted worsening abdominal pain and bloating over the past several weeks. Subsequent workup included CT scan of the abdomen which revealed omental caking highly suspicious for underlying peritoneal carcinomatosis. Patient otherwise feels well. She has no neurologic complaints. She denies any recent fevers. She has a fair appetite, but denies weight loss. She has no chest pain or shortness of breath. She denies any nausea, vomiting, constipation, or diarrhea. She has no urinary complaints. Patient otherwise feels well and offers no further specific complaints.  REVIEW OF SYSTEMS:   Review of Systems  Constitutional: Positive for malaise/fatigue. Negative for fever and weight loss.  Respiratory: Negative.  Negative for cough, hemoptysis and shortness of breath.   Cardiovascular: Negative.  Negative for chest pain and leg swelling.  Gastrointestinal: Positive for abdominal pain. Negative for blood in stool, constipation, diarrhea, melena, nausea and vomiting.  Genitourinary: Negative.   Musculoskeletal: Negative.   Neurological: Positive for weakness.  Psychiatric/Behavioral: Negative.     As per HPI. Otherwise, a complete review of systems is negative.  PAST MEDICAL HISTORY: Past Medical History:  Diagnosis Date  . Anemia   . Arthritis 10 years  . CHF (congestive heart failure) (HCC)    Dr Nehemiah Massed  . Dizziness   . Heart murmur 10 years  . Hypertension 10 years   . Malignant neoplasm of lower-inner quadrant of female breast ([place]) 2012   Invasive lobular carcinoma of the left breast, LCIS. 2.2 cm. Negative sentinel node. ER/PR negative., HER-2/neu nonamplified.  . Personal history of radiation therapy   . Thyroid disease     PAST SURGICAL HISTORY: Past Surgical History:  Procedure Laterality Date  . ABDOMINAL HYSTERECTOMY  age 81    partial  . APPENDECTOMY  age 11  . BREAST BIOPSY Left 2012   +  . BREAST LUMPECTOMY Left 2012   with radiation  . COLONOSCOPY  2013   [name]  . COSMETIC SURGERY    . ERCP W/ SPHICTEROTOMY  June 2015, Verdie Shire,  MD   Follow-up ultrasound showed no residual cholelithiasis.  Marland Kitchen EYE SURGERY Bilateral   . gallstones removed  03/04/14   ERCP  . UPPER GI ENDOSCOPY  2013    FAMILY HISTORY: Family History  Problem Relation Age of Onset  . Breast cancer Maternal Grandmother 75  . Breast cancer Mother   . Diabetes Mother   . Heart disease Mother   . Stroke Father     ADVANCED DIRECTIVES (Y/N):  N  HEALTH MAINTENANCE: Social History  Substance Use Topics  . Smoking status: Never Smoker  . Smokeless tobacco: Never Used  . Alcohol use No     Colonoscopy:  PAP:  Bone density:  Lipid panel:  No Known Allergies  No current facility-administered medications for this visit.    No current outpatient prescriptions on file.   Facility-Administered Medications Ordered in Other Visits  Medication Dose Route Frequency Provider Last Rate Last Dose  . 0.9 %  sodium chloride infusion   Intravenous Continuous Epifanio Lesches, MD 75 mL/hr at 12/26/2016 2334    .  0.9 %  sodium chloride infusion  100 mL Intravenous PRN Munsoor Lateef, MD      . 0.9 %  sodium chloride infusion  100 mL Intravenous PRN Munsoor Lateef, MD      . alteplase (CATHFLO ACTIVASE) injection 2 mg  2 mg Intracatheter Once PRN Munsoor Lateef, MD      . diphenhydrAMINE (BENADRYL) capsule 25 mg  25 mg Oral QHS PRN Lance Coon, MD   25 mg at 12/18/2016  2355  . docusate sodium (COLACE) capsule 100 mg  100 mg Oral Daily PRN Gladstone Lighter, MD      . ferrous sulfate tablet 325 mg  325 mg Oral Once per day on Mon Wed Fri Epifanio Lesches, MD      . heparin injection 1,000 Units  1,000 Units Dialysis PRN Munsoor Lateef, MD      . heparin injection 5,000 Units  5,000 Units Subcutaneous Q8H Epifanio Lesches, MD      . levothyroxine (SYNTHROID, LEVOTHROID) tablet 75 mcg  75 mcg Oral QAC breakfast Epifanio Lesches, MD   75 mcg at 12/12/16 0837  . lidocaine (PF) (XYLOCAINE) 1 % injection 5 mL  5 mL Intradermal PRN Munsoor Lateef, MD      . lidocaine-prilocaine (EMLA) cream 1 application  1 application Topical PRN Munsoor Lateef, MD      . metoprolol succinate (TOPROL-XL) 24 hr tablet 25 mg  25 mg Oral BID Epifanio Lesches, MD   25 mg at 12/21/2016 2335  . ondansetron (ZOFRAN) tablet 4 mg  4 mg Oral Q6H PRN Epifanio Lesches, MD       Or  . ondansetron (ZOFRAN) injection 4 mg  4 mg Intravenous Q6H PRN Epifanio Lesches, MD      . oxybutynin (DITROPAN) tablet 5 mg  5 mg Oral BID Epifanio Lesches, MD   5 mg at 12/12/16 0836  . oxyCODONE-acetaminophen (PERCOCET/ROXICET) 5-325 MG per tablet 1 tablet  1 tablet Oral Q8H PRN Lance Coon, MD   1 tablet at 12/12/16 0945  . pantoprazole (PROTONIX) EC tablet 40 mg  40 mg Oral Daily Epifanio Lesches, MD   40 mg at 12/12/16 0836  . pentafluoroprop-tetrafluoroeth (GEBAUERS) aerosol 1 application  1 application Topical PRN Munsoor Lateef, MD      . vitamin B-12 (CYANOCOBALAMIN) tablet 1,000 mcg  1,000 mcg Oral Daily Epifanio Lesches, MD   1,000 mcg at 12/12/16 0836    OBJECTIVE: Vitals:   12/15/2016 1531  BP: 100/64  Pulse: (!) 103  Resp: 18  Temp: 99.5 F (37.5 C)     Body mass index is 30.32 kg/m.    ECOG FS:2 - Symptomatic, <50% confined to bed  General: Well-developed, well-nourished, no acute distress. Eyes: Pink conjunctiva, anicteric sclera. HEENT: Normocephalic, moist mucous  membranes, clear oropharnyx. Lungs: Clear to auscultation bilaterally. Heart: Regular rate and rhythm. No rubs, murmurs, or gallops. Abdomen: Distended, nontender. Normoactive bowel sounds. Musculoskeletal: No edema, cyanosis, or clubbing. Neuro: Alert, answering all questions appropriately. Cranial nerves grossly intact. Skin: No rashes or petechiae noted. Psych: Normal affect. Lymphatics: No cervical, calvicular, axillary or inguinal LAD.   LAB RESULTS:  Lab Results  Component Value Date   NA 139 12/12/2016   K 5.4 (H) 12/12/2016   CL 110 12/12/2016   CO2 23 12/12/2016   GLUCOSE 95 12/12/2016   BUN 72 (H) 12/12/2016   CREATININE 3.08 (H) 12/12/2016   CALCIUM 8.7 (L) 12/12/2016   PROT 6.9 12/09/2016   ALBUMIN 2.9 (L) 12/27/2016   AST  18 12/26/2016   ALT 13 (L) 12/06/2016   ALKPHOS 51 12/14/2016   BILITOT 0.5 01/01/2017   GFRNONAA 12 (L) 12/12/2016   GFRAA 14 (L) 12/12/2016    Lab Results  Component Value Date   WBC 5.9 12/12/2016   NEUTROABS 5.3 12/30/2016   HGB 9.9 (L) 12/12/2016   HCT 30.1 (L) 12/12/2016   MCV 87.0 12/12/2016   PLT 356 12/12/2016   Lab Results  Component Value Date   CA125 2,804.0 (H) 12/06/2016   Lab Results  Component Value Date   CEA 2.3 12/13/2016     STUDIES: Ct Abdomen Pelvis Wo Contrast  Result Date: 11/23/2016 CLINICAL DATA:  Abdominal pain and distention for 2 weeks. Renal insufficiency. Hysterectomy and appendectomy. No history of cancer. EXAM: CT ABDOMEN AND PELVIS WITHOUT CONTRAST TECHNIQUE: Multidetector CT imaging of the abdomen and pelvis was performed following the standard protocol without IV contrast. COMPARISON:  Renal ultrasound 09/26/2015.  CT 02/22/2014 FINDINGS: Lower chest: Clear lung bases. Mild cardiomegaly with lad coronary artery atherosclerosis. Small hiatal hernia. Hepatobiliary: Marked hepatic steatosis without gross liver lesion. Normal gallbladder. Common duct is upper normal for age, 10 mm on image 27/series  2. No calcified stones. Pancreas: Normal pancreas for age, without duct dilatation. Spleen: Normal in size, without focal abnormality. Adrenals/Urinary Tract: Normal adrenal glands. Mild renal cortical thinning. No hydronephrosis. Normal urinary bladder. Stomach/Bowel: Normal distal stomach. Proximal gastric underdistention. Scattered colonic diverticula. Normal terminal ileum. Normal small bowel caliber. Vascular/Lymphatic: Aortic and branch vessel atherosclerosis. No abdominopelvic adenopathy. Reproductive: Hysterectomy.  No adnexal mass. Other: Moderate pelvic floor laxity. Small volume abdominopelvic ascites. Extensive soft tissue thickening throughout the omentum, including on image 42/series 2. No well-defined dominant omental mass. Musculoskeletal: Lumbosacral spondylosis. IMPRESSION: 1. Small volume abdominopelvic ascites with extensive relatively diffuse omental thickening. Findings are suspicious for carcinomatosis. No primary malignancy identified. 2. Hepatic steatosis. 3. Hiatal hernia. 4.  Coronary artery atherosclerosis. Aortic atherosclerosis. 5. Pelvic floor laxity. These results will be called to the ordering clinician or representative by the Radiologist Assistant, and communication documented in the PACS or zVision Dashboard. Electronically Signed   By: Abigail Miyamoto M.D.   On: 11/23/2016 19:56    ASSESSMENT: Peritoneal carcinomatosis.  PLAN:    1. Peritoneal carcinomatosis: CT scan results reviewed independently and reported as above. Patient also has a significantly elevated CA 125 of nearly 3000. CT-guided biopsy has been ordered for diagnosis, although both patient's daughters have already indicated that they would likely not to pursue any treatments. Patient will return to clinic in 2 weeks for discussion of her results. 2. Acute renal failure: Patient noted to have a creatinine greater than 3.0 and was subsequently sent to the emergency room for further evaluation. 3. Hyperkalemia:  ER evaluation as above. 4. Anemia: Mild, monitor.  Approximately 45 minutes was spent in discussion of which greater than 50% was consultation.  Patient expressed understanding and was in agreement with this plan. She also understands that She can call clinic at any time with any questions, concerns, or complaints.   Cancer Staging No matching staging information was found for the patient.  Lloyd Huger, MD   12/12/2016 11:58 AM

## 2016-12-11 NOTE — Consult Note (Signed)
[facility] CONSULT NOTE    Date: 12/19/2016                  Patient Name:  Whitney Woods  MRN: [id number]  DOB: 01/30/1925  Age / Sex: 83 y.o., female         PCP: Gayland Curry, MD                 Service Requesting Consult: Emergency department                 Reason for Consult: Acute renal failure, severe hyperkalemia            History of Present Illness: Patient is a 83 y.o. female with a PMHx of Recent diagnosis of abdominal carcinomatosis, vitamin D deficiency, lower extremity edema, hypothyroidism, hypertension, hyperlipidemia, chronic urinary frequency, who was admitted to [facility] on 12/10/2016 for evaluation of recently diagnosed abdominal carcinomatosis, acute renal failure, and severe hyperkalemia. The patient has recently been having increasing abdominal distention, abdominal pain, and poor by mouth intake.  As a result she developed dehydration.  She has underlying chronic kidney disease. Her baseline creatinine appears to be 1.5 with an EGFR of 33. CT scan of the abdomen and pelvis was negative for hydronephrosis however there was cortical thinning. In addition this revealed abdominal carcinomatosis along with a mild amount of ascites.  Patient was also on lisinopril and Lasix.   Medications: Outpatient medications:  (Not in a hospital admission)  Current medications: Current Facility-Administered Medications  Medication Dose Route Frequency Provider Last Rate Last Dose  . 0.9 %  sodium chloride infusion   Intravenous Continuous Epifanio Lesches, MD      . albuterol (PROVENTIL) (2.5 MG/3ML) 0.083% nebulizer solution 2.5 mg  2.5 mg Nebulization Once Delman Kitten, MD      . aspirin tablet 81 mg  81 mg Oral Daily Epifanio Lesches, MD      . docusate sodium (COLACE) capsule 100 mg  100 mg Oral BID Epifanio Lesches, MD      . ferrous sulfate tablet 325 mg  325 mg Oral Once per day on Mon Wed Fri Epifanio Lesches, MD      . heparin  injection 5,000 Units  5,000 Units Subcutaneous Q8H Epifanio Lesches, MD      . Derrill Memo ON 12/12/2016] levothyroxine (SYNTHROID, LEVOTHROID) tablet 75 mcg  75 mcg Oral QAC breakfast Epifanio Lesches, MD      . metoprolol succinate (TOPROL-XL) 24 hr tablet 25 mg  25 mg Oral BID Epifanio Lesches, MD      . ondansetron (ZOFRAN) tablet 4 mg  4 mg Oral Q6H PRN Epifanio Lesches, MD       Or  . ondansetron (ZOFRAN) injection 4 mg  4 mg Intravenous Q6H PRN Epifanio Lesches, MD      . oxybutynin (DITROPAN) tablet 5 mg  5 mg Oral BID Epifanio Lesches, MD      . pantoprazole (PROTONIX) EC tablet 40 mg  40 mg Oral Daily Epifanio Lesches, MD      . vitamin B-12 (CYANOCOBALAMIN) tablet 1,000 mcg  1,000 mcg Oral Daily Epifanio Lesches, MD       Current Outpatient Prescriptions  Medication Sig Dispense Refill  . aspirin 81 MG tablet Take 81 mg by mouth daily.    . Cholecalciferol (VITAMIN D-3) 1000 UNITS CAPS Take 2 capsules by mouth daily.     . clobetasol cream (TEMOVATE) 9.70 % Apply 1 application topically 2 (two) times daily.    Marland Kitchen  furosemide (LASIX) 40 MG tablet Take 40 mg by mouth daily.    Marland Kitchen gabapentin (NEURONTIN) 100 MG capsule Take 100 mg by mouth daily as needed. Reported on 02/06/2016    . levothyroxine (SYNTHROID, LEVOTHROID) 75 MCG tablet Take 75 mcg by mouth daily before breakfast.    . lisinopril (PRINIVIL,ZESTRIL) 20 MG tablet Take 20 mg by mouth daily. Reported on 02/06/2016    . loperamide (IMODIUM) 2 MG capsule Take 2 mg by mouth as needed for diarrhea or loose stools. Reported on 02/06/2016    . metoprolol succinate (TOPROL-XL) 100 MG 24 hr tablet Take 25 mg by mouth 2 (two) times daily. Take with or immediately following a meal.    . mometasone (ELOCON) 0.1 % lotion Apply 1 application topically as needed (to scalp).    Marland Kitchen omeprazole (PRILOSEC) 40 MG capsule Take 40 mg by mouth daily.    Marland Kitchen oxybutynin (DITROPAN) 5 MG tablet Take 5 mg by mouth 2 (two) times daily.    .  simvastatin (ZOCOR) 20 MG tablet Take 20 mg by mouth daily.    . vitamin B-12 (CYANOCOBALAMIN) 1000 MCG tablet Take 1,000 mcg by mouth daily.        Allergies: No Known Allergies    Past Medical History: Past Medical History:  Diagnosis Date  . Anemia   . Arthritis 10 years  . CHF (congestive heart failure) (HCC)    Dr Nehemiah Massed  . Dizziness   . Heart murmur 10 years  . Hypertension 10 years  . Malignant neoplasm of lower-inner quadrant of female breast ([place]) 2012   Invasive lobular carcinoma of the left breast, LCIS. 2.2 cm. Negative sentinel node. ER/PR negative., HER-2/neu nonamplified.  . Personal history of radiation therapy   . Thyroid disease      Past Surgical History: Past Surgical History:  Procedure Laterality Date  . ABDOMINAL HYSTERECTOMY  age 15    partial  . APPENDECTOMY  age 16  . BREAST BIOPSY Left 2012   +  . BREAST LUMPECTOMY Left 2012   with radiation  . COLONOSCOPY  2013   [name]  . COSMETIC SURGERY    . ERCP W/ SPHICTEROTOMY  June 2015, Verdie Shire,  MD   Follow-up ultrasound showed no residual cholelithiasis.  Marland Kitchen EYE SURGERY Bilateral   . gallstones removed  03/04/14   ERCP  . UPPER GI ENDOSCOPY  2013     Family History: Family History  Problem Relation Age of Onset  . Breast cancer Maternal Grandmother 73  . Breast cancer Mother   . Diabetes Mother   . Heart disease Mother   . Stroke Father      Social History: Social History   Social History  . Marital status: Widowed    Spouse name: N/A  . Number of children: N/A  . Years of education: N/A   Occupational History  . Not on file.   Social History Main Topics  . Smoking status: Never Smoker  . Smokeless tobacco: Never Used  . Alcohol use No  . Drug use: No  . Sexual activity: No   Other Topics Concern  . Not on file   Social History Narrative  . No narrative on file     Review of Systems: Review of Systems  Constitutional: Positive for malaise/fatigue. Negative for  chills, fever and weight loss.  HENT: Positive for congestion. Negative for ear pain and hearing loss.   Eyes: Negative for blurred vision and photophobia.  Respiratory: Negative for cough,  hemoptysis and sputum production.   Cardiovascular: Negative for chest pain, palpitations and orthopnea.  Gastrointestinal: Positive for abdominal pain and melena.  Genitourinary: Positive for dysuria and urgency.  Musculoskeletal: Negative for joint pain and myalgias.  Skin: Negative for itching and rash.  Neurological: Positive for weakness. Negative for dizziness and focal weakness.  Endo/Heme/Allergies: Negative for polydipsia. Does not bruise/bleed easily.  Psychiatric/Behavioral: Negative for depression. The patient is nervous/anxious.      Vital Signs: Blood pressure 124/60, pulse (!) 106, temperature 98.4 F (36.9 C), temperature source Oral, resp. rate 16, weight 74.8 kg (165 lb), SpO2 96 %.  Weight trends: Filed Weights   01/02/2017 1708  Weight: 74.8 kg (165 lb)    Physical Exam: General: NAD, laying in bed  Head: Normocephalic, atraumatic.  Eyes: Anicteric, EOMI  Nose: Mucous membranes moist, not inflammed, nonerythematous.  Throat: Oropharynx nonerythematous, no exudate appreciated.   Neck: Supple, trachea midline.  Lungs:  Normal respiratory effort. Clear to auscultation BL without crackles or wheezes.  Heart: RRR. S1 and S2 normal without gallop, murmur, or rubs.  Abdomen:  Distended, BS present, no rebound  Extremities: No pretibial edema.  Neurologic: A&O X3, Motor strength is 5/5 in the all 4 extremities  Skin: No visible rashes, scars.    Lab results: Basic Metabolic Panel:  Recent Labs Lab 12/10/2016 1556 12/12/2016 1723  NA 135 137  K 7.0* 6.6*  CL 109 112*  CO2 18* 15*  GLUCOSE 129* 134*  BUN 76* 97*  CREATININE 3.60* 3.68*  CALCIUM 9.2 9.0    Liver Function Tests:  Recent Labs Lab 12/29/2016 1556 12/06/2016 1723  AST 18 18  ALT 15 13*  ALKPHOS 56 51   BILITOT 0.3 0.5  PROT 7.2 6.9  ALBUMIN 3.0* 2.9*   No results for input(s): LIPASE, AMYLASE in the last 168 hours. No results for input(s): AMMONIA in the last 168 hours.  CBC:  Recent Labs Lab 12/19/2016 1556 12/29/2016 1723  WBC 7.8 7.1  NEUTROABS 5.6 5.3  HGB 11.1* 10.8*  HCT 35.2 33.8*  MCV 88.1 87.9  PLT 477* 443*    Cardiac Enzymes: No results for input(s): CKTOTAL, CKMB, CKMBINDEX, TROPONINI in the last 168 hours.  BNP: Invalid input(s): POCBNP  CBG:  Recent Labs Lab 12/24/2016 1733  GLUCAP 228*    Microbiology: Results for orders placed or performed during the hospital encounter of 09/26/15  Urine culture     Status: None   Collection Time: 09/26/15  6:20 PM  Result Value Ref Range Status   Specimen Description URINE, RANDOM  Final   Special Requests NONE  Final   Culture MULTIPLE SPECIES PRESENT, SUGGEST RECOLLECTION  Final   Report Status 09/28/2015 FINAL  Final  Culture, blood (Routine X 2) w Reflex to ID Panel     Status: None   Collection Time: 09/26/15  7:20 PM  Result Value Ref Range Status   Specimen Description BLOOD LEFT ANTECUBITAL  Final   Special Requests BOTTLES DRAWN AEROBIC AND ANAEROBIC  1CC  Final   Culture NO GROWTH 5 DAYS  Final   Report Status 10/01/2015 FINAL  Final  Culture, blood (Routine X 2) w Reflex to ID Panel     Status: None   Collection Time: 09/26/15  7:20 PM  Result Value Ref Range Status   Specimen Description BLOOD RIGHT ANTECUBITAL  Final   Special Requests BOTTLES DRAWN AEROBIC AND ANAEROBIC  1CC  Final   Culture  Setup Time   Final  GRAM POSITIVE COCCI AEROBIC BOTTLE ONLY CRITICAL RESULT CALLED TO, READ BACK BY AND VERIFIED WITH: [place] AT 2352 09/27/15.PMH CONFIRMED BY [facility]    Culture   Final    COAGULASE NEGATIVE STAPHYLOCOCCUS AEROBIC BOTTLE ONLY Results consistent with contamination.    Report Status 10/02/2015 FINAL  Final  Blood Culture ID Panel (Reflexed)     Status: Abnormal   Collection Time:  09/26/15  7:20 PM  Result Value Ref Range Status   Enterococcus species NOT DETECTED NOT DETECTED Final   Listeria monocytogenes NOT DETECTED NOT DETECTED Final   Staphylococcus species DETECTED (A) NOT DETECTED Final    Comment: CRITICAL RESULT CALLED TO, READ BACK BY AND VERIFIED WITH: NATE COOKSON AT 2352 09/27/15.PMH    Staphylococcus aureus NOT DETECTED NOT DETECTED Final   Streptococcus species NOT DETECTED NOT DETECTED Final   Streptococcus agalactiae NOT DETECTED NOT DETECTED Final   Streptococcus pneumoniae NOT DETECTED NOT DETECTED Final   Streptococcus pyogenes NOT DETECTED NOT DETECTED Final   Acinetobacter baumannii NOT DETECTED NOT DETECTED Final   Enterobacteriaceae species NOT DETECTED NOT DETECTED Final   Enterobacter cloacae complex NOT DETECTED NOT DETECTED Final   Escherichia coli NOT DETECTED NOT DETECTED Final   Klebsiella oxytoca NOT DETECTED NOT DETECTED Final   Klebsiella pneumoniae NOT DETECTED NOT DETECTED Final   Proteus species NOT DETECTED NOT DETECTED Final   Serratia marcescens NOT DETECTED NOT DETECTED Final   Haemophilus influenzae NOT DETECTED NOT DETECTED Final   Neisseria meningitidis NOT DETECTED NOT DETECTED Final   Pseudomonas aeruginosa NOT DETECTED NOT DETECTED Final   Candida albicans NOT DETECTED NOT DETECTED Final   Candida glabrata NOT DETECTED NOT DETECTED Final   Candida krusei NOT DETECTED NOT DETECTED Final   Candida parapsilosis NOT DETECTED NOT DETECTED Final   Candida tropicalis NOT DETECTED NOT DETECTED Final   Carbapenem resistance NOT DETECTED NOT DETECTED Final   Methicillin resistance NOT DETECTED NOT DETECTED Final   Vancomycin resistance NOT DETECTED NOT DETECTED Final    Coagulation Studies: No results for input(s): LABPROT, INR in the last 72 hours.  Urinalysis: No results for input(s): COLORURINE, LABSPEC, PHURINE, GLUCOSEU, HGBUR, BILIRUBINUR, KETONESUR, PROTEINUR, UROBILINOGEN, NITRITE, LEUKOCYTESUR in the last 72  hours.  Invalid input(s): APPERANCEUR    Imaging:  No results found.   Assessment & Plan: Pt is a 83 y.o. female with a PMHx of Recent diagnosis of abdominal carcinomatosis, vitamin D deficiency, lower extremity edema, hypothyroidism, hypertension, hyperlipidemia, chronic urinary frequency, who was admitted to [facility] on 12/10/2016 for evaluation of recently diagnosed abdominal carcinomatosis, acute renal failure, and severe hyperkalemia.  1.  Acute renal failure due to prolonged deydration, lisinopril, lasix. 2.  CKD stage III baseline Cr 1.5 3.  Severe hyperkalemia. 4.  Anemia of CKD  5.  Abdominal carcinomatosis.  Plan:  We are asked to see the patient urgently for evaluation management of acute renal failure, chronic kidney disease stage III, and severe hyperkalemia. She has recently been diagnosed with abdominal carcinomatosis. She likely developed poor by mouth intake as a result of this. Patient was also on lisinopril and Lasix at home. Recommend holding these. Patient informed of her underlying cancer diagnosis. At this point time she would like to consider further therapy. As such she is also open to renal replacement therapy at this time. We counseled the patient on risks, benefits, and alternatives to renal placement therapy and she has opted to proceed. This was also discussed with her son. We will consult with  vascular surgery urgently to place a temporary dialysis catheter. Thereafter we will proceed with short session of hemodialysis tonight and likely another one tomorrow. Thanks for consultation.

## 2016-12-11 NOTE — Progress Notes (Signed)
HD STARTED  

## 2016-12-11 NOTE — Progress Notes (Signed)
HD COMPLETED  

## 2016-12-11 NOTE — Progress Notes (Signed)
Received call from Dr. Holley Raring that pt will need emergency HD. Spoke with [facility] ED RN. No access for HD as of this time.

## 2016-12-11 NOTE — ED Notes (Signed)
Patient transported to dialysis

## 2016-12-11 NOTE — Procedures (Signed)
Central Venous Catheter Insertion Procedure Note Ana Woodroof [id number] [birth year]  Procedure: Insertion of Hemodialysis Catheter Indications: Acute kidney failure, hyperkalemia  Procedure Details Consent: Risks of procedure as well as the alternatives and risks of each were explained to the (patient/caregiver).  Consent for procedure obtained. Time Out: Verified patient identification, verified procedure, site/side was marked, verified correct patient position, special equipment/implants available, medications/allergies/relevent history reviewed, required imaging and test results available.  Performed  Maximum sterile technique was used including antiseptics, cap, gloves, gown, hand hygiene, mask and sheet. Skin prep: Chlorhexidine; local anesthetic administered A antimicrobial bonded/coated double lumen temporary hemodialysis catheter was placed in the right femoral vein due to patient being a dialysis patient using the Seldinger technique. The catheter was packed with heparin.  Evaluation Blood flow good Complications: No apparent complications Patient did tolerate procedure well.   Nijah Orlich A 12/27/2016, 7:56 PM

## 2016-12-11 NOTE — ED Notes (Signed)
RN at bedside with Vascular MD to assist with dialysis catheter insertion

## 2016-12-11 NOTE — Telephone Encounter (Signed)
Phone call from [facility] in [employer];  Critical lab report with a Potassium 7.1. Dr Grayland Ormond notified. He asked what her kidney function was; I called Ivin Booty back in the lab she said the creatinine is 3.60. BUN is not back- it needs to be diluted. [place] CMA is calling patient/family back with instructions to go to ED.Marland Kitchen

## 2016-12-11 NOTE — Consult Note (Signed)
Reason for Consult: Acute kidney failure, hyperkalemia Referring Physician: Dr. Ernest Haber [place] is an 83 y.o. female.  HPI: Patient with carcinomatosis, found to have hyperkalemia- K-7 and acute kidney failure. Now requires emergent hemodialysis and catheter placement.  Past Medical History:  Diagnosis Date  . Anemia   . Arthritis 10 years  . CHF (congestive heart failure) (HCC)    Dr Nehemiah Massed  . Dizziness   . Heart murmur 10 years  . Hypertension 10 years  . Malignant neoplasm of lower-inner quadrant of female breast ([place]) 2012   Invasive lobular carcinoma of the left breast, LCIS. 2.2 cm. Negative sentinel node. ER/PR negative., HER-2/neu nonamplified.  . Personal history of radiation therapy   . Thyroid disease     Past Surgical History:  Procedure Laterality Date  . ABDOMINAL HYSTERECTOMY  age 20    partial  . APPENDECTOMY  age 39  . BREAST BIOPSY Left 2012   +  . BREAST LUMPECTOMY Left 2012   with radiation  . COLONOSCOPY  2013   [name]  . COSMETIC SURGERY    . ERCP W/ SPHICTEROTOMY  June 2015, Verdie Shire,  MD   Follow-up ultrasound showed no residual cholelithiasis.  Marland Kitchen EYE SURGERY Bilateral   . gallstones removed  03/04/14   ERCP  . UPPER GI ENDOSCOPY  2013    Family History  Problem Relation Age of Onset  . Breast cancer Maternal Grandmother 10  . Breast cancer Mother   . Diabetes Mother   . Heart disease Mother   . Stroke Father     Social History:  reports that she has never smoked. She has never used smokeless tobacco. She reports that she does not drink alcohol or use drugs.  Allergies: No Known Allergies  Medications: I have reviewed the patient's current medications.  Results for orders placed or performed during the hospital encounter of 12/18/2016 (from the past 48 hour(s))  CBC with Differential     Status: Abnormal   Collection Time: 12/08/2016  5:23 PM  Result Value Ref Range   WBC 7.1 3.6 - 11.0 K/uL   RBC 3.85 3.80 - 5.20 MIL/uL    Hemoglobin 10.8 (L) 12.0 - 16.0 g/dL   HCT 33.8 (L) 35.0 - 47.0 %   MCV 87.9 80.0 - 100.0 fL   MCH 28.1 26.0 - 34.0 pg   MCHC 32.0 32.0 - 36.0 g/dL   RDW 14.8 (H) 11.5 - 14.5 %   Platelets 443 (H) 150 - 440 K/uL   Neutrophils Relative % 75 %   Neutro Abs 5.3 1.4 - 6.5 K/uL   Lymphocytes Relative 12 %   Lymphs Abs 0.9 (L) 1.0 - 3.6 K/uL   Monocytes Relative 10 %   Monocytes Absolute 0.7 0.2 - 0.9 K/uL   Eosinophils Relative 2 %   Eosinophils Absolute 0.2 0 - 0.7 K/uL   Basophils Relative 1 %   Basophils Absolute 0.1 0 - 0.1 K/uL  Comprehensive metabolic panel     Status: Abnormal   Collection Time: 12/26/2016  5:23 PM  Result Value Ref Range   Sodium 137 135 - 145 mmol/L   Potassium 6.6 (HH) 3.5 - 5.1 mmol/L    Comment: CRITICAL RESULT CALLED TO, READ BACK BY AND VERIFIED WITH ASHLEY SMITH AT 1804 01/03/2017.PMH   Chloride 112 (H) 101 - 111 mmol/L   CO2 15 (L) 22 - 32 mmol/L   Glucose, Bld 134 (H) 65 - 99 mg/dL   BUN 97 (H)  6 - 20 mg/dL    Comment: RESULT CONFIRMED BY MANUAL DILUTION.PMH   Creatinine, Ser 3.68 (H) 0.44 - 1.00 mg/dL   Calcium 9.0 8.9 - 10.3 mg/dL   Total Protein 6.9 6.5 - 8.1 g/dL   Albumin 2.9 (L) 3.5 - 5.0 g/dL   AST 18 15 - 41 U/L   ALT 13 (L) 14 - 54 U/L   Alkaline Phosphatase 51 38 - 126 U/L   Total Bilirubin 0.5 0.3 - 1.2 mg/dL   GFR calc non Af Amer 10 (L) >60 mL/min   GFR calc Af Amer 12 (L) >60 mL/min    Comment: (NOTE) The eGFR has been calculated using the CKD EPI equation. This calculation has not been validated in all clinical situations. eGFR's persistently <60 mL/min signify possible Chronic Kidney Disease.    Anion gap 10 5 - 15  Glucose, capillary     Status: Abnormal   Collection Time: 12/10/2016  5:33 PM  Result Value Ref Range   Glucose-Capillary 228 (H) 65 - 99 mg/dL  Protime-INR     Status: None   Collection Time: 12/27/2016  6:01 PM  Result Value Ref Range   Prothrombin Time 13.6 11.4 - 15.2 seconds   INR 1.04   APTT     Status: None    Collection Time: 12/29/2016  6:01 PM  Result Value Ref Range   aPTT 24 24 - 36 seconds  Basic metabolic panel     Status: Abnormal   Collection Time: 12/06/2016  6:39 PM  Result Value Ref Range   Sodium 139 135 - 145 mmol/L   Potassium 5.9 (H) 3.5 - 5.1 mmol/L   Chloride 112 (H) 101 - 111 mmol/L   CO2 18 (L) 22 - 32 mmol/L   Glucose, Bld 117 (H) 65 - 99 mg/dL   BUN 96 (H) 6 - 20 mg/dL    Comment: RESULT CONFIRMED BY MANUAL DILUTION   Creatinine, Ser 3.78 (H) 0.44 - 1.00 mg/dL   Calcium 10.1 8.9 - 10.3 mg/dL   GFR calc non Af Amer 10 (L) >60 mL/min   GFR calc Af Amer 11 (L) >60 mL/min    Comment: (NOTE) The eGFR has been calculated using the CKD EPI equation. This calculation has not been validated in all clinical situations. eGFR's persistently <60 mL/min signify possible Chronic Kidney Disease.    Anion gap 9 5 - 15    No results found.  Review of Systems  Constitutional: Positive for malaise/fatigue.  HENT: Negative.   Eyes: Negative.   Cardiovascular: Negative for chest pain and palpitations.  Gastrointestinal: Positive for abdominal pain.  Skin: Negative.    Blood pressure (!) 109/52, pulse (!) 112, temperature 98.4 F (36.9 C), temperature source Oral, resp. rate (!) 22, weight 74.8 kg (165 lb), SpO2 99 %. Physical Exam  Nursing note and vitals reviewed. Constitutional: She is oriented to person, place, and time. She appears well-developed.  Cardiovascular:  Irregular  Respiratory: Effort normal.  GI: Soft. She exhibits distension. She exhibits no mass. There is tenderness. There is no rebound and no guarding.  Neurological: She is alert and oriented to person, place, and time.    Assessment/Plan: Patient with carcinomatosis , AKI and hyperkalemia requiring emergent Hemodialysis.  Will place emergent hemodialysis catheter. Consent obtained. Risk, benefits and alternatives explained. Understanding expressed. Harry Bark A 01/01/2017, 7:51 PM

## 2016-12-11 NOTE — ED Provider Notes (Signed)
[facility] Emergency Department Provider Note   ____________________________________________   First MD Initiated Contact with Patient 12/20/2016 1720     (approximate)  I have reviewed the triage vital signs and the nursing notes.   HISTORY  Chief Complaint hyperkalemia    HPI Blessyn [place] is a 83 y.o. female presents for evaluation of elevated potassium  Patient had labs drawn today for follow-up for diagnosis of carcinomatosis ease, she received recommendation advising come emergency department for right away.  She says some chronic abdominal discomfort, decreased appetite which is attributed to carcinomatosis. She denies having ever had dialysis. She has had decreased appetite and not been eating or drinking well for the last 2 weeks according to patient and family.  No chest pain. No trouble breathing. No fevers chills or recent illness otherwise.  Denies being in pain.     Past Medical History:  Diagnosis Date  . Anemia   . Arthritis 10 years  . CHF (congestive heart failure) (HCC)    Dr Nehemiah Massed  . Dizziness   . Heart murmur 10 years  . Hypertension 10 years  . Malignant neoplasm of lower-inner quadrant of female breast ([place]) 2012   Invasive lobular carcinoma of the left breast, LCIS. 2.2 cm. Negative sentinel node. ER/PR negative., HER-2/neu nonamplified.  . Personal history of radiation therapy   . Thyroid disease     Patient Active Problem List   Diagnosis Date Noted  . Peritoneal carcinomatosis ([place]) 01/01/2017  . Hyperkalemia 12/12/2016  . AP (abdominal pain) 02/07/2016  . Acute renal failure (ARF) ([place]) 09/26/2015  . Choledocholithiasis 03/06/2014  . Personal history of breast cancer 05/10/2013    Past Surgical History:  Procedure Laterality Date  . ABDOMINAL HYSTERECTOMY  age 38    partial  . APPENDECTOMY  age 47  . BREAST BIOPSY Left 2012   +  . BREAST LUMPECTOMY Left 2012   with radiation  . COLONOSCOPY   2013   [name]  . COSMETIC SURGERY    . ERCP W/ SPHICTEROTOMY  June 2015, Verdie Shire,  MD   Follow-up ultrasound showed no residual cholelithiasis.  Marland Kitchen EYE SURGERY Bilateral   . gallstones removed  03/04/14   ERCP  . UPPER GI ENDOSCOPY  2013    Prior to Admission medications   Medication Sig Start Date End Date Taking? Authorizing Provider  aspirin 81 MG tablet Take 81 mg by mouth daily.   Yes Historical Provider, MD  Cholecalciferol (VITAMIN D-3) 1000 UNITS CAPS Take 2 capsules by mouth daily.    Yes Historical Provider, MD  clobetasol cream (TEMOVATE) 3.26 % Apply 1 application topically 2 (two) times daily.   Yes Historical Provider, MD  furosemide (LASIX) 40 MG tablet Take 40 mg by mouth daily. 09/09/15  Yes Historical Provider, MD  gabapentin (NEURONTIN) 100 MG capsule Take 100 mg by mouth daily as needed. Reported on 02/06/2016   Yes Historical Provider, MD  levothyroxine (SYNTHROID, LEVOTHROID) 75 MCG tablet Take 75 mcg by mouth daily before breakfast.   Yes Historical Provider, MD  lisinopril (PRINIVIL,ZESTRIL) 20 MG tablet Take 20 mg by mouth daily. Reported on 02/06/2016 04/23/14  Yes Historical Provider, MD  loperamide (IMODIUM) 2 MG capsule Take 2 mg by mouth as needed for diarrhea or loose stools. Reported on 02/06/2016   Yes Historical Provider, MD  metoprolol succinate (TOPROL-XL) 100 MG 24 hr tablet Take 25 mg by mouth 2 (two) times daily. Take with or immediately following a meal.  Yes Historical Provider, MD  mometasone (ELOCON) 0.1 % lotion Apply 1 application topically as needed (to scalp).   Yes Historical Provider, MD  omeprazole (PRILOSEC) 40 MG capsule Take 40 mg by mouth daily.   Yes Historical Provider, MD  oxybutynin (DITROPAN) 5 MG tablet Take 5 mg by mouth 2 (two) times daily. 09/09/15  Yes Historical Provider, MD  simvastatin (ZOCOR) 20 MG tablet Take 20 mg by mouth daily.   Yes Historical Provider, MD  vitamin B-12 (CYANOCOBALAMIN) 1000 MCG tablet Take 1,000 mcg by mouth daily.    Yes Historical Provider, MD    Allergies Patient has no known allergies.  Family History  Problem Relation Age of Onset  . Breast cancer Maternal Grandmother 83  . Breast cancer Mother   . Diabetes Mother   . Heart disease Mother   . Stroke Father     Social History Social History  Substance Use Topics  . Smoking status: Never Smoker  . Smokeless tobacco: Never Used  . Alcohol use No    Review of Systems Constitutional: No fever/chills ENT: No sore throat. Cardiovascular: Denies chest pain. Respiratory: Denies shortness of breath. Gastrointestinal: No abdominal pain.  No nausea, no vomiting.   Genitourinary: Negative for dysuria. Denies feeling like she is unable to urinate. Musculoskeletal: Negative for back pain. Neurological: Negative for headaches, focal weakness or numbness. Increased fatigue.  10-point ROS otherwise negative.  ____________________________________________   PHYSICAL EXAM:  VITAL SIGNS: ED Triage Vitals  Enc Vitals Group     BP 12/14/2016 1707 124/60     Pulse Rate 12/30/2016 1707 (!) 106     Resp 12/26/2016 1707 16     Temp 12/08/2016 1707 98.4 F (36.9 C)     Temp Source 12/31/2016 1707 Oral     SpO2 12/22/2016 1707 96 %     Weight 01/02/2017 1708 165 lb (74.8 kg)     Height --      Head Circumference --      Peak Flow --      Pain Score --      Pain Loc --      Pain Edu? --      Excl. in [place]? --     Constitutional: Alert and oriented. Well appearing and in no acute distress.Very pleasant. Eyes: Conjunctivae are normal. PERRL. EOMI. Head: Atraumatic. Nose: No congestion/rhinnorhea. Mouth/Throat: Mucous membranes are moist.  Oropharynx non-erythematous. Neck: No stridor.   Cardiovascular: Tachycardic rate, regular rhythm. Grossly normal heart sounds.  Good peripheral circulation. Respiratory: Normal respiratory effort.  No retractions. Lungs CTAB. Gastrointestinal: Soft and nontender. No distention. Musculoskeletal: No lower extremity  tenderness nor edema.   Neurologic:  Normal speech and language. No gross focal neurologic deficits are appreciated.  Skin:  Skin is warm, dry and intact. No rash noted. Psychiatric: Mood and affect are normal. Speech and behavior are normal.  ____________________________________________   LABS (all labs ordered are listed, but only abnormal results are displayed)  Labs Reviewed  CBC WITH DIFFERENTIAL/PLATELET - Abnormal; Notable for the following:       Result Value   Hemoglobin 10.8 (*)    HCT 33.8 (*)    RDW 14.8 (*)    Platelets 443 (*)    Lymphs Abs 0.9 (*)    All other components within normal limits  COMPREHENSIVE METABOLIC PANEL - Abnormal; Notable for the following:    Potassium 6.6 (*)    Chloride 112 (*)    CO2 15 (*)    Glucose,  Bld 134 (*)    BUN 97 (*)    Creatinine, Ser 3.68 (*)    Albumin 2.9 (*)    ALT 13 (*)    GFR calc non Af Amer 10 (*)    GFR calc Af Amer 12 (*)    All other components within normal limits  GLUCOSE, CAPILLARY - Abnormal; Notable for the following:    Glucose-Capillary 228 (*)    All other components within normal limits  PROTIME-INR  APTT  BASIC METABOLIC PANEL  BASIC METABOLIC PANEL  CBC  CBG MONITORING, ED   ____________________________________________  EKG  Reviewed and interpreted by me at 1724 Ventricular rate 145 QRS 100 QTC 500 Probable sinus with first-degree AV block Tachycardia Concerning for possible slurring of the T waves ____________________________________________  RADIOLOGY   ____________________________________________   PROCEDURES  Procedure(s) performed: None  Procedures  Critical Care performed: Yes, see critical care note(s)  CRITICAL CARE Performed by: Delman Kitten   Total critical care time: 50 minutes  Critical care time was exclusive of separately billable procedures and treating other patients.  Critical care was necessary to treat or prevent imminent or life-threatening  deterioration.  Critical care was time spent personally by me on the following activities: development of treatment plan with patient and/or surrogate as well as nursing, discussions with consultants, evaluation of patient's response to treatment, examination of patient, obtaining history from patient or surrogate, ordering and performing treatments and interventions, ordering and review of laboratory studies, ordering and review of radiographic studies, pulse oximetry and re-evaluation of patient's condition.  ____________________________________________   INITIAL IMPRESSION / ASSESSMENT AND PLAN / ED COURSE  Pertinent labs & imaging results that were available during my care of the patient were reviewed by me and considered in my medical decision making (see chart for details).  Patient presents critical hyperkalemia. This was treated aggressively initially with consultations by nephrology, vascular surgery. I suspect the patient has some renal insufficiency likely due to prerenal cause, reports poor appetite decrease eating for the last few weeks attributed to carcinomatosis. She is asymptomatic to the fact of her elevated potassium, and received call to come the emergency department. No acute neurologic cardiac or pulmonary deficits noted based on clinical history, but EKG is concerning for slurring her T waves consistent with severe hyperkalemia at significant risk for immediate cardiac decompensation and/or arrest.  ----------------------------------------- 5:47 PM on 12/14/2016 -----------------------------------------  Discussed patient's case, labs with Dr. Holley Raring who advises placement of a hemodialysis catheter in arranging emergent hemodialysis. Discussed with the patient, she indicated that she would be agreeable to life saving treatments. She indicated she would be agreeable to having surgeon evaluate her for catheter placement and need for dialysis. In addition, CODE STATUS is  clarified and she is full code. Emergent treatments including calcium chloride, bicarbonate, insulin and glucose have been administered.   ----------------------------------------- 6:51 PM on 12/20/2016 -----------------------------------------  Patient remains hemodynamically improved, alert and oriented. Has been seen by nephrology and is now admitted to the hospitalist service for ongoing care. Await vascular surgery consult, which has been called and placed an emergent consult.     ____________________________________________   FINAL CLINICAL IMPRESSION(S) / ED DIAGNOSES  Final diagnoses:  Acute hyperkalemia  AKI (acute kidney injury) ([place])      NEW MEDICATIONS STARTED DURING THIS VISIT:  New Prescriptions   No medications on file     Note:  This document was prepared using Dragon voice recognition software and may include unintentional dictation errors.  Delman Kitten, MD 12/06/2016 [phone]

## 2016-12-11 NOTE — Progress Notes (Signed)
POST DIALYSIS ASSESSMENT 

## 2016-12-12 LAB — BASIC METABOLIC PANEL
Anion gap: 6 (ref 5–15)
BUN: 72 mg/dL — AB (ref 6–20)
CALCIUM: 8.7 mg/dL — AB (ref 8.9–10.3)
CO2: 23 mmol/L (ref 22–32)
CREATININE: 3.08 mg/dL — AB (ref 0.44–1.00)
Chloride: 110 mmol/L (ref 101–111)
GFR calc Af Amer: 14 mL/min — ABNORMAL LOW (ref >60)
GFR, EST NON AFRICAN AMERICAN: 12 mL/min — AB (ref >60)
Glucose, Bld: 95 mg/dL (ref 65–99)
Potassium: 5.4 mmol/L — ABNORMAL HIGH (ref 3.5–5.1)
SODIUM: 139 mmol/L (ref 135–145)

## 2016-12-12 LAB — CEA: CEA: 2.3 ng/mL (ref 0.0–4.7)

## 2016-12-12 LAB — CBC
HCT: 30.1 % — ABNORMAL LOW (ref 35.0–47.0)
Hemoglobin: 9.9 g/dL — ABNORMAL LOW (ref 12.0–16.0)
MCH: 28.6 pg (ref 26.0–34.0)
MCHC: 32.8 g/dL (ref 32.0–36.0)
MCV: 87 fL (ref 80.0–100.0)
PLATELETS: 356 10*3/uL (ref 150–440)
RBC: 3.46 MIL/uL — ABNORMAL LOW (ref 3.80–5.20)
RDW: 14.8 % — AB (ref 11.5–14.5)
WBC: 5.9 10*3/uL (ref 3.6–11.0)

## 2016-12-12 LAB — PHOSPHORUS: Phosphorus: 4.9 mg/dL — ABNORMAL HIGH (ref 2.5–4.6)

## 2016-12-12 LAB — MRSA PCR SCREENING: MRSA BY PCR: NEGATIVE

## 2016-12-12 LAB — CA 125: CA 125: 2804 U/mL — ABNORMAL HIGH (ref 0.0–38.1)

## 2016-12-12 LAB — GLUCOSE, CAPILLARY: Glucose-Capillary: 101 mg/dL — ABNORMAL HIGH (ref 65–99)

## 2016-12-12 MED ORDER — DOCUSATE SODIUM 100 MG PO CAPS: 100.0000 mg | ORAL_CAPSULE | Freq: Every day | ORAL | Status: DC | PRN

## 2016-12-12 MED FILL — Medication: Qty: 1 | Status: AC

## 2016-12-12 NOTE — Progress Notes (Signed)
[facility]  ROUNDING NOTE   Subjective:  Patient had first hemodialysis treatment last night. She tolerated this well. Potassium down to 5.4. She is seen and evaluated during her second dialysis treatment today.   Objective:  Vital signs in last 24 hours:  Temp:  [98.4 F (36.9 C)-99.5 F (37.5 C)] 98.6 F (37 C) (04/07 1025) Pulse Rate:  [99-120] 106 (04/07 1200) Resp:  [15-26] 22 (04/07 1100) BP: (63-128)/(45-78) 96/50 (04/07 1200) SpO2:  [95 %-99 %] 95 % (04/07 0740) Weight:  [74.6 kg (164 lb 6.4 oz)-77.1 kg (169 lb 15.6 oz)] 77.1 kg (169 lb 15.6 oz) (04/07 1025)  Weight change:  Filed Weights   01/01/2017 2317 12/12/16 0547 12/12/16 1025  Weight: 74.6 kg (164 lb 6.4 oz) 74.6 kg (164 lb 6.4 oz) 77.1 kg (169 lb 15.6 oz)    Intake/Output: No intake/output data recorded.   Intake/Output this shift:  Total I/O In: -  Out: 100 [Urine:100]  Physical Exam: General: No acute distress  Head: Normocephalic, atraumatic. Moist oral mucosal membranes  Eyes: Anicteric  Neck: Supple, trachea midline  Lungs:  Clear to auscultation, normal effort  Heart: S1S2 no rubs  Abdomen:  Soft, mild diffuse tenderness, distension noted  Extremities: no peripheral edema.  Neurologic: Nonfocal, moving all four extremities  Skin: No lesions  Access: Temporary right femoral dialysis catheter    Basic Metabolic Panel:  Recent Labs Lab 12/22/2016 1556 12/23/2016 1723 12/13/2016 1839 12/15/2016 2043 12/18/2016 2230 12/12/16 0439  NA 135 137 139  --   --  139  K 7.0* 6.6* 5.9*  --  4.8 5.4*  CL 109 112* 112*  --   --  110  CO2 18* 15* 18*  --   --  23  GLUCOSE 129* 134* 117*  --   --  95  BUN 76* 97* 96*  --   --  72*  CREATININE 3.60* 3.68* 3.78*  --   --  3.08*  CALCIUM 9.2 9.0 10.1  --   --  8.7*  PHOS  --   --   --  4.2  --  4.9*    Liver Function Tests:  Recent Labs Lab 01/01/2017 1556 12/15/2016 1723  AST 18 18  ALT 15 13*  ALKPHOS 56 51  BILITOT 0.3 0.5  PROT 7.2  6.9  ALBUMIN 3.0* 2.9*   No results for input(s): LIPASE, AMYLASE in the last 168 hours. No results for input(s): AMMONIA in the last 168 hours.  CBC:  Recent Labs Lab 12/06/2016 1556 01/04/2017 1723 12/12/16 0439  WBC 7.8 7.1 5.9  NEUTROABS 5.6 5.3  --   HGB 11.1* 10.8* 9.9*  HCT 35.2 33.8* 30.1*  MCV 88.1 87.9 87.0  PLT 477* 443* 356    Cardiac Enzymes: No results for input(s): CKTOTAL, CKMB, CKMBINDEX, TROPONINI in the last 168 hours.  BNP: Invalid input(s): POCBNP  CBG:  Recent Labs Lab 12/12/2016 1733 12/12/16 0808  GLUCAP 228* 101*    Microbiology: Results for orders placed or performed during the hospital encounter of 12/28/2016  MRSA PCR Screening     Status: None   Collection Time: 12/24/2016 11:27 PM  Result Value Ref Range Status   MRSA by PCR NEGATIVE NEGATIVE Final    Comment:        The GeneXpert MRSA Assay (FDA approved for NASAL specimens only), is one component of a comprehensive MRSA colonization surveillance program. It is not intended to diagnose MRSA infection nor to guide or monitor  treatment for MRSA infections.     Coagulation Studies:  Recent Labs  12/21/2016 1801  LABPROT 13.6  INR 1.04    Urinalysis: No results for input(s): COLORURINE, LABSPEC, PHURINE, GLUCOSEU, HGBUR, BILIRUBINUR, KETONESUR, PROTEINUR, UROBILINOGEN, NITRITE, LEUKOCYTESUR in the last 72 hours.  Invalid input(s): APPERANCEUR    Imaging: No results found.   Medications:   . sodium chloride 75 mL/hr at 12/15/2016 2334   . ferrous sulfate  325 mg Oral Once per day on Mon Wed Fri  . heparin  5,000 Units Subcutaneous Q8H  . levothyroxine  75 mcg Oral QAC breakfast  . metoprolol succinate  25 mg Oral BID  . oxybutynin  5 mg Oral BID  . pantoprazole  40 mg Oral Daily  . vitamin B-12  1,000 mcg Oral Daily   sodium chloride, sodium chloride, alteplase, diphenhydrAMINE, docusate sodium, heparin, lidocaine (PF), lidocaine-prilocaine, ondansetron **OR**  ondansetron (ZOFRAN) IV, oxyCODONE-acetaminophen, pentafluoroprop-tetrafluoroeth  Assessment/ Plan:  83 y.o. female with a PMHx of Recent diagnosis of abdominal carcinomatosis, vitamin D deficiency, lower extremity edema, hypothyroidism, hypertension, hyperlipidemia, chronic urinary frequency, who was [facility] to [facility] 12/25/2016 for evaluation of recently diagnosed abdominal carcinomatosis, acute renal failure, and severe hyperkalemia.  1.  Acute renal failure due to prolonged deydration, lisinopril, lasix. 2.  CKD stage III baseline Cr 1.5 3.  Severe hyperkalemia. 4.  Anemia of CKD  5.  Abdominal carcinomatosis.  Plan: Patient seen and evaluated during second dialysis treatment. Potassium has come down but is still slightly high at 5.4. We plan to complete her second dialysis treatment today. We will reassess the patient for dialysis again most likely on Monday. Continue IV fluid hydration for now. Unclear if she is a candidate for aggressive chemotherapy given her age and overall debility. Await further input from oncology. We will continue to monitor the patient's progress closely along with you.   LOS: 1 Naylee Frankowski 4/7/201812:03 PM

## 2016-12-12 NOTE — Progress Notes (Signed)
PRE DIALYSIS ASSESSMENT 

## 2016-12-12 NOTE — Progress Notes (Signed)
Post HD assessment unchanged  

## 2016-12-12 NOTE — Progress Notes (Addendum)
[place] [facility] [place] NAME: Whitney Woods    MR#:  [id number]  DATE OF BIRTH:  1925/04/16  SUBJECTIVE:  CHIEF COMPLAINT:   Chief Complaint  Patient presents with  . hyperkalemia    REVIEW OF SYSTEMS:  Review of Systems  Constitutional: Negative for chills, fever and malaise/fatigue.  HENT: Positive for hearing loss. Negative for ear discharge, ear pain and nosebleeds.   Eyes: Negative for blurred vision and double vision.  Respiratory: Negative for cough, shortness of breath and wheezing.   Cardiovascular: Negative for chest pain, palpitations and leg swelling.  Gastrointestinal: Positive for abdominal pain and diarrhea. Negative for constipation, nausea and vomiting.  Genitourinary: Negative for dysuria.  Musculoskeletal: Negative for myalgias.  Neurological: Negative for dizziness, speech change, focal weakness, seizures and headaches.  Psychiatric/Behavioral: Negative for depression.    DRUG ALLERGIES:  No Known Allergies  VITALS:  Blood pressure (!) 99/51, pulse 99, temperature 98.6 F (37 C), temperature source Oral, resp. rate 16, weight 74.6 kg (164 lb 6.4 oz), SpO2 95 %.  PHYSICAL EXAMINATION:  Physical Exam  GENERAL:  83 y.o.-year-old patient lying in the bed with no acute distress. Hard of hearing EYES: Pupils equal, round, reactive to light and accommodation. No scleral icterus. Extraocular muscles intact.  HEENT: Head atraumatic, normocephalic. Oropharynx and nasopharynx clear.  NECK:  Supple, no jugular venous distention. No thyroid enlargement, no tenderness.  LUNGS: Normal breath sounds bilaterally, no wheezing, rales,rhonchi or crepitation. No use of accessory muscles of respiration. Decreased bibasilar breath sounds CARDIOVASCULAR: S1, S2 normal. No murmurs, rubs, or gallops.  ABDOMEN: Soft, but very distended, nontender. Bowel sounds present. No organomegaly or mass.  EXTREMITIES: No pedal edema, cyanosis, or  clubbing. Very scaly lower extremity skin changes Right groin temporary dialysis catheter present NEUROLOGIC: Cranial nerves II through XII are intact. Muscle strength 5/5 in all extremities. Sensation intact. Gait not checked.  PSYCHIATRIC: The patient is alert and oriented x 3.  SKIN: No obvious rash, lesion, or ulcer.    LABORATORY PANEL:   CBC  Recent Labs Lab 12/12/16 0439  WBC 5.9  HGB 9.9*  HCT 30.1*  PLT 356   ------------------------------------------------------------------------------------------------------------------  Chemistries   Recent Labs Lab 12/27/2016 1723  12/12/16 0439  NA 137  < > 139  K 6.6*  < > 5.4*  CL 112*  < > 110  CO2 15*  < > 23  GLUCOSE 134*  < > 95  BUN 97*  < > 72*  CREATININE 3.68*  < > 3.08*  CALCIUM 9.0  < > 8.7*  AST 18  --   --   ALT 13*  --   --   ALKPHOS 51  --   --   BILITOT 0.5  --   --   < > = values in this interval not displayed. ------------------------------------------------------------------------------------------------------------------  Cardiac Enzymes No results for input(s): TROPONINI in the last 168 hours. ------------------------------------------------------------------------------------------------------------------  RADIOLOGY:  No results found.  EKG:   Orders placed or performed during the hospital encounter of 12/19/2016  . ED EKG  . ED EKG    ASSESSMENT AND PLAN:   83 year old female with past medical history significant for hypertension, anemia, history of breast cancer status post lumpectomy about 6 years ago, sent in from oncology office for potassium of 7.  #1 hyperkalemia-secondary to renal failure and also was on lisinopril as outpatient. -Started on emergency dialysis yesterday and received one session. Potassium is at 5.4 today.  Maybe will need 1 more session of dialysis today as renal function is still not back to baseline  #2 acute renal failure on CK D-baseline CK D stage III with  creatinine of 1.5. Currently creatinine is at 3.08 with GFR of 12. -Management per nephrology. Likely will need another session of hemodialysis at this time. Avoid nephrotoxins. Continue to hold Lasix and his Toprol  #3 peritoneal carcinomatosis-abdominal distention with nausea and poor by mouth intake going on for a few weeks now. -Was supposed to see oncology in the office yesterday for the first time however since potassium was elevated was sent to emergency room. -CT of the abdomen was done in the emergency room 3 weeks ago for diarrhea and nausea vomiting. -Patient states she had a colonoscopy in the past which was completely normal. No source of primary is identified at this time. -Oncology consulted. Tumor markers have been ordered today  #4 hypertension-continue Toprol  #5 chronic diastolic heart failure-currently Lasix is on hold due to acute renal failure. Monitor closely. No acute exacerbation.  #6 DVT prophylaxis-on subcutaneous heparin   Physical therapy after the temporary catheter is removed   All the records are reviewed and case discussed with Care Management/Social Workerr. Management plans discussed with the patient, family and they are in agreement.  CODE STATUS: Full code  TOTAL TIME TAKING CARE OF THIS PATIENT: 38 minutes.   POSSIBLE D/C IN 2 DAYS, DEPENDING ON CLINICAL CONDITION.   Gladstone Lighter M.D on 12/12/2016 at 8:39 AM  Between 7am to 6pm - Pager - [phone]  After 6pm go to [email or web address] - password [email or web address] [facility]  Office  [phone]  CC: Primary care physician; Gayland Curry, MD

## 2016-12-12 NOTE — Progress Notes (Signed)
HD completed without issue. No UF as ordered. BP soft throughout treatment. Patient remained asymptomatic. Patient tolerated well. Catheter not working well. Arterial pressure high despite repositioning, flushing, and reversing lines. Report called to Counselling psychologist.

## 2016-12-12 NOTE — Progress Notes (Signed)
Patient arrived to 2A Room 236. Patient denies pain and all questions answered. Patient oriented to unit and Fall Safety Plan signed. Skin assessment completed with Crystal RN and HD catheter noted to right groin and skin dry, but otherwise intact. A&Ox4, VSS, and ST on verified tele-box #[id number]. Nursing staff will continue to monitor for any changes in patient status. Earleen Reaper, RN

## 2016-12-12 NOTE — Progress Notes (Signed)
HD STARTED  

## 2016-12-12 NOTE — Plan of Care (Signed)
Problem: Physical Regulation: Goal: Ability to maintain clinical measurements within normal limits will improve Outcome: Not Progressing Patients potassium 5.4. Patient completed HD treatment today.

## 2016-12-13 DIAGNOSIS — K8050 Calculus of bile duct without cholangitis or cholecystitis without obstruction: Secondary | ICD-10-CM

## 2016-12-13 DIAGNOSIS — Z79899 Other long term (current) drug therapy: Secondary | ICD-10-CM

## 2016-12-13 DIAGNOSIS — N179 Acute kidney failure, unspecified: Principal | ICD-10-CM

## 2016-12-13 DIAGNOSIS — C786 Secondary malignant neoplasm of retroperitoneum and peritoneum: Secondary | ICD-10-CM

## 2016-12-13 DIAGNOSIS — R42 Dizziness and giddiness: Secondary | ICD-10-CM

## 2016-12-13 DIAGNOSIS — D649 Anemia, unspecified: Secondary | ICD-10-CM

## 2016-12-13 DIAGNOSIS — Z853 Personal history of malignant neoplasm of breast: Secondary | ICD-10-CM

## 2016-12-13 DIAGNOSIS — R011 Cardiac murmur, unspecified: Secondary | ICD-10-CM

## 2016-12-13 DIAGNOSIS — R531 Weakness: Secondary | ICD-10-CM

## 2016-12-13 DIAGNOSIS — R140 Abdominal distension (gaseous): Secondary | ICD-10-CM

## 2016-12-13 DIAGNOSIS — M129 Arthropathy, unspecified: Secondary | ICD-10-CM

## 2016-12-13 DIAGNOSIS — C801 Malignant (primary) neoplasm, unspecified: Secondary | ICD-10-CM

## 2016-12-13 DIAGNOSIS — R109 Unspecified abdominal pain: Secondary | ICD-10-CM

## 2016-12-13 DIAGNOSIS — E079 Disorder of thyroid, unspecified: Secondary | ICD-10-CM

## 2016-12-13 DIAGNOSIS — E875 Hyperkalemia: Secondary | ICD-10-CM

## 2016-12-13 DIAGNOSIS — Z803 Family history of malignant neoplasm of breast: Secondary | ICD-10-CM

## 2016-12-13 DIAGNOSIS — R5383 Other fatigue: Secondary | ICD-10-CM

## 2016-12-13 DIAGNOSIS — Z923 Personal history of irradiation: Secondary | ICD-10-CM

## 2016-12-13 LAB — HIV ANTIBODY (ROUTINE TESTING W REFLEX): HIV SCREEN 4TH GENERATION: NONREACTIVE

## 2016-12-13 LAB — BASIC METABOLIC PANEL
ANION GAP: 10 (ref 5–15)
BUN: 54 mg/dL — ABNORMAL HIGH (ref 6–20)
CALCIUM: 8.4 mg/dL — AB (ref 8.9–10.3)
CO2: 18 mmol/L — ABNORMAL LOW (ref 22–32)
Chloride: 110 mmol/L (ref 101–111)
Creatinine, Ser: 2.37 mg/dL — ABNORMAL HIGH (ref 0.44–1.00)
GFR calc non Af Amer: 17 mL/min — ABNORMAL LOW (ref >60)
GFR, EST AFRICAN AMERICAN: 20 mL/min — AB (ref >60)
Glucose, Bld: 102 mg/dL — ABNORMAL HIGH (ref 65–99)
Potassium: 5.4 mmol/L — ABNORMAL HIGH (ref 3.5–5.1)
SODIUM: 138 mmol/L (ref 135–145)

## 2016-12-13 LAB — PARATHYROID HORMONE, INTACT (NO CA): PTH: 27 pg/mL (ref 15–65)

## 2016-12-13 LAB — GLUCOSE, CAPILLARY: Glucose-Capillary: 135 mg/dL — ABNORMAL HIGH (ref 65–99)

## 2016-12-13 LAB — HEPATITIS B SURFACE ANTIBODY, QUANTITATIVE: Hepatitis B-Post: 3.2 m[IU]/mL — ABNORMAL LOW (ref >9.9)

## 2016-12-13 LAB — CA 125: CA 125: 2580 U/mL — AB (ref 0.0–38.1)

## 2016-12-13 LAB — CEA: CEA: 2 ng/mL (ref 0.0–4.7)

## 2016-12-13 LAB — HEPATITIS B SURFACE ANTIGEN: HEP B S AG: NEGATIVE

## 2016-12-13 MED ORDER — PATIROMER SORBITEX CALCIUM 8.4 G PO PACK
8.4000 g | PACK | Freq: Every day | ORAL | Status: DC
  Administered 2016-12-13: 8.4 g via ORAL
  Filled 2016-12-13 (×2): qty 4

## 2016-12-13 MED ORDER — NYSTATIN 100000 UNIT/GM EX POWD
Freq: Two times a day (BID) | CUTANEOUS | Status: DC
  Administered 2016-12-13 – 2016-12-15 (×5): via TOPICAL
  Filled 2016-12-13: qty 15

## 2016-12-13 NOTE — Progress Notes (Signed)
Notified MD Marcille Blanco of potassium level 5.4; no new orders at this time. Nursing staff will continue to monitor for any changes in patient status. Earleen Reaper, RN

## 2016-12-13 NOTE — Progress Notes (Signed)
[place] at [place] NAME: Whitney Woods    MR#:  [id number]  DATE OF BIRTH:  10/09/1924  SUBJECTIVE:  CHIEF COMPLAINT:   Chief Complaint  Patient presents with  . hyperkalemia   - Has multiple complaints. Complains of diffuse abdominal pain. Unhappy for staying in the hospital. Significant incontinence and bowel leakage with that.  REVIEW OF SYSTEMS:  Review of Systems  Constitutional: Positive for malaise/fatigue. Negative for chills and fever.  HENT: Positive for hearing loss. Negative for ear discharge, ear pain and nosebleeds.   Eyes: Negative for blurred vision and double vision.  Respiratory: Negative for cough, shortness of breath and wheezing.   Cardiovascular: Negative for chest pain, palpitations and leg swelling.  Gastrointestinal: Positive for abdominal pain and diarrhea. Negative for constipation, nausea and vomiting.  Genitourinary: Negative for dysuria.       Incontinence  Musculoskeletal: Negative for myalgias.  Neurological: Negative for dizziness, speech change, focal weakness, seizures and headaches.  Psychiatric/Behavioral: Negative for depression.    DRUG ALLERGIES:  No Known Allergies  VITALS:  Blood pressure (!) 128/45, pulse (!) 104, temperature 98.1 F (36.7 C), temperature source Oral, resp. rate 17, weight 77 kg (169 lb 11.2 oz), SpO2 92 %.  PHYSICAL EXAMINATION:  Physical Exam  GENERAL:  83 y.o.-year-old patient lying in the bed with no acute distress. Hard of hearing EYES: Pupils equal, round, reactive to light and accommodation. No scleral icterus. Extraocular muscles intact.  HEENT: Head atraumatic, normocephalic. Oropharynx and nasopharynx clear.  NECK:  Supple, no jugular venous distention. No thyroid enlargement, no tenderness.  LUNGS: Normal breath sounds bilaterally, no wheezing, rales,rhonchi or crepitation. No use of accessory muscles of respiration. Decreased bibasilar breath  sounds CARDIOVASCULAR: S1, S2 normal. No murmurs, rubs, or gallops.  ABDOMEN: Soft, but distended, diffusely tender- mostly in the left flank region. Bowel sounds present. No organomegaly or mass.  EXTREMITIES: No pedal edema, cyanosis, or clubbing. Very scaly lower extremity skin changes Right groin temporary dialysis catheter present NEUROLOGIC: Cranial nerves II through XII are intact. Muscle strength 5/5 in all extremities. Sensation intact. Gait not checked.  PSYCHIATRIC: The patient is alert and oriented x 3.  SKIN: No obvious rash, lesion, or ulcer.    LABORATORY PANEL:   CBC  Recent Labs Lab 12/12/16 0439  WBC 5.9  HGB 9.9*  HCT 30.1*  PLT 356   ------------------------------------------------------------------------------------------------------------------  Chemistries   Recent Labs Lab 12/17/2016 1723  12/13/16 0456  NA 137  < > 138  K 6.6*  < > 5.4*  CL 112*  < > 110  CO2 15*  < > 18*  GLUCOSE 134*  < > 102*  BUN 97*  < > 54*  CREATININE 3.68*  < > 2.37*  CALCIUM 9.0  < > 8.4*  AST 18  --   --   ALT 13*  --   --   ALKPHOS 51  --   --   BILITOT 0.5  --   --   < > = values in this interval not displayed. ------------------------------------------------------------------------------------------------------------------  Cardiac Enzymes No results for input(s): TROPONINI in the last 168 hours. ------------------------------------------------------------------------------------------------------------------  RADIOLOGY:  No results found.  EKG:   Orders placed or performed during the hospital encounter of 12/23/2016  . ED EKG  . ED EKG    ASSESSMENT AND PLAN:   83 year old female with past medical history significant for hypertension, anemia, history of breast cancer status post lumpectomy about 6  years ago, sent in from oncology office for potassium of 7.  #1 hyperkalemia-secondary to renal failure and also was on lisinopril as outpatient. -Started on  emergency dialysis and received 2 sessions. Potassium is still at 5.4 today.  - No dialysis today. Started on Veltasa and monitor  #2 acute renal failure on CK D-baseline CK D stage III with creatinine of 1.5. Currently creatinine is at 2.3 after 2 sessions of dialysis. -Management per nephrology. - not a candidate likely for permanent dialysis especially since family were not too sure about chemotherapy. Needs to discuss with family. For now, we'll continue with the temporary dialysis catheter - foley catheter for strict input and output monitoring  #3 peritoneal carcinomatosis-abdominal distention with nausea and poor by mouth intake going on for a few weeks now. -CT of the abdomen was done in the emergency room 3 weeks ago for diarrhea and nausea vomiting which showed the omental mets. -Patient states she had a colonoscopy in the past which was completely normal. No source of primary is identified at this time. -Oncology consulted. Tumor markers ordered.  - IR consult for possible biopsy tomorrow  #4 hypertension and sinus tachycardia-continue Toprol  #5 chronic diastolic heart failure-currently Lasix is on hold due to acute renal failure. Monitor closely. No acute exacerbation.  #6 DVT prophylaxis-on subcutaneous heparin   Physical therapy after the temporary catheter is removed Updated son yesterday   All the records are reviewed and case discussed with Care Management/Social Workerr. Management plans discussed with the patient, family and they are in agreement.  CODE STATUS: Full code  TOTAL TIME TAKING CARE OF THIS PATIENT: 36 minutes.   POSSIBLE D/C IN 2 DAYS, DEPENDING ON CLINICAL CONDITION.   Gladstone Lighter M.D on 12/13/2016 at 10:01 AM  Between 7am to 6pm - Pager - [phone]  After 6pm go to [email or web address] - password [email or web address] [facility]  Office  [phone]  CC: Primary care physician; Gayland Curry, MD

## 2016-12-13 NOTE — Plan of Care (Signed)
Problem: Pain Managment: Goal: General experience of comfort will improve Outcome: Not Progressing Patient c/o abdominal pain. Percocet given to patient. Will continue to monitor and assess. Patient educated to inform staff when she is having pain and would like something to help.

## 2016-12-13 NOTE — Plan of Care (Signed)
Problem: Activity: Goal: Risk for activity intolerance will decrease Outcome: Not Progressing Patient up to [facility], patient stated she felt very weak, MD notified, PT consult ordered to evaluate patient.

## 2016-12-13 NOTE — Progress Notes (Signed)
[facility]  ROUNDING NOTE   Subjective:  Patient seen at bedside. The patient's son is also at the bedside. Patient was quite frustrated with the hemodialysis procedure yesterday. Recorded urine output over the preceding 24 hours was 850 cc. Serum potassium remains a bit high at 5.4. Patient was administered patiromer this AM.    Objective:  Vital signs in last 24 hours:  Temp:  [97.6 F (36.4 C)-100 F (37.8 C)] 98.1 F (36.7 C) (04/08 0825) Pulse Rate:  [104-117] 104 (04/08 0825) Resp:  [17-18] 17 (04/08 0825) BP: (111-131)/(45-63) 128/45 (04/08 0825) SpO2:  [92 %-97 %] 92 % (04/08 0825) Weight:  [77 kg (169 lb 11.2 oz)] 77 kg (169 lb 11.2 oz) (04/08 0347)  Weight change: 2.256 kg (4 lb 15.6 oz) Filed Weights   12/12/16 1025 12/12/16 1249 12/13/16 0347  Weight: 77.1 kg (169 lb 15.6 oz) 77.1 kg (169 lb 15.6 oz) 77 kg (169 lb 11.2 oz)    Intake/Output: I/O last 3 completed shifts: In: 2561.3 [P.O.:240; I.V.:2321.3] Out: 555 [Urine:850]   Intake/Output this shift:  Total I/O In: 240 [P.O.:240] Out: 200 [Urine:200]  Physical Exam: General: No acute distress  Head: Normocephalic, atraumatic. Moist oral mucosal membranes  Eyes: Anicteric  Neck: Supple, trachea midline  Lungs:  Clear to auscultation, normal effort  Heart: S1S2 no rubs  Abdomen:  Soft, mild diffuse tenderness, distension noted  Extremities: no peripheral edema.  Neurologic: Nonfocal, moving all four extremities  Skin: No lesions  Access: Temporary right femoral dialysis catheter    Basic Metabolic Panel:  Recent Labs Lab 12/10/2016 1556 12/10/2016 1723 12/12/2016 1839 12/15/2016 2043 12/26/2016 2230 12/12/16 0439 12/13/16 0456  NA 135 137 139  --   --  139 138  K 7.0* 6.6* 5.9*  --  4.8 5.4* 5.4*  CL 109 112* 112*  --   --  110 110  CO2 18* 15* 18*  --   --  23 18*  GLUCOSE 129* 134* 117*  --   --  95 102*  BUN 76* 97* 96*  --   --  72* 54*  CREATININE 3.60* 3.68* 3.78*  --   --   3.08* 2.37*  CALCIUM 9.2 9.0 10.1  --   --  8.7* 8.4*  PHOS  --   --   --  4.2  --  4.9*  --     Liver Function Tests:  Recent Labs Lab 12/29/2016 1556 12/31/2016 1723  AST 18 18  ALT 15 13*  ALKPHOS 56 51  BILITOT 0.3 0.5  PROT 7.2 6.9  ALBUMIN 3.0* 2.9*   No results for input(s): LIPASE, AMYLASE in the last 168 hours. No results for input(s): AMMONIA in the last 168 hours.  CBC:  Recent Labs Lab 12/26/2016 1556 12/29/2016 1723 12/12/16 0439  WBC 7.8 7.1 5.9  NEUTROABS 5.6 5.3  --   HGB 11.1* 10.8* 9.9*  HCT 35.2 33.8* 30.1*  MCV 88.1 87.9 87.0  PLT 477* 443* 356    Cardiac Enzymes: No results for input(s): CKTOTAL, CKMB, CKMBINDEX, TROPONINI in the last 168 hours.  BNP: Invalid input(s): POCBNP  CBG:  Recent Labs Lab 12/19/2016 1733 12/12/16 0808 12/13/16 0742  GLUCAP 228* 101* 135*    Microbiology: Results for orders placed or performed during the hospital encounter of 12/27/2016  MRSA PCR Screening     Status: None   Collection Time: 12/18/2016 11:27 PM  Result Value Ref Range Status   MRSA by PCR NEGATIVE NEGATIVE  Final    Comment:        The GeneXpert MRSA Assay (FDA approved for NASAL specimens only), is one component of a comprehensive MRSA colonization surveillance program. It is not intended to diagnose MRSA infection nor to guide or monitor treatment for MRSA infections.     Coagulation Studies:  Recent Labs  12/06/2016 1801  LABPROT 13.6  INR 1.04    Urinalysis: No results for input(s): COLORURINE, LABSPEC, PHURINE, GLUCOSEU, HGBUR, BILIRUBINUR, KETONESUR, PROTEINUR, UROBILINOGEN, NITRITE, LEUKOCYTESUR in the last 72 hours.  Invalid input(s): APPERANCEUR    Imaging: No results found.   Medications:   . sodium chloride 75 mL/hr at 12/13/16 0213   . ferrous sulfate  325 mg Oral Once per day on Mon Wed Fri  . heparin  5,000 Units Subcutaneous Q8H  . levothyroxine  75 mcg Oral QAC breakfast  . metoprolol succinate  25 mg Oral  BID  . oxybutynin  5 mg Oral BID  . pantoprazole  40 mg Oral Daily  . patiromer  8.4 g Oral Daily  . vitamin B-12  1,000 mcg Oral Daily   diphenhydrAMINE, docusate sodium, ondansetron **OR** ondansetron (ZOFRAN) IV, oxyCODONE-acetaminophen  Assessment/ Plan:  83 y.o. female with a PMHx of Recent diagnosis of abdominal carcinomatosis, vitamin D deficiency, lower extremity edema, hypothyroidism, hypertension, hyperlipidemia, chronic urinary frequency, who was admitted to [facility] on 01/01/2017 for evaluation of recently diagnosed abdominal carcinomatosis, acute renal failure, and severe hyperkalemia.  1.  Acute renal failure due to prolonged deydration, lisinopril, lasix. 2.  CKD stage III baseline Cr 1.5 3.  Severe hyperkalemia, improved 4.  Anemia of CKD, hgb 9.9 5.  Abdominal carcinomatosis, pending biopsy.  Plan:  Patient has undergone 2 dialysis treatments. BUN/creatinine have improved however potassium is still a bit high at 5.4 with a serum bicarbonate of 18. Patient has been started on patiromer.  Patient has been a bit frustrated with the dialysis procedure and finds it difficult to lay in bed during the dialysis treatment. I do long discussion with the patient's son today. She will be having a tissue biopsy to determine the etiology of her underlying abdominal carcinomatosis. However she is unlikely to be off her very aggressive chemotherapy. As such she is not a candidate for long-term dialysis. We will reevaluate the patient for dialysis tomorrow.   LOS: 2 Leib Elahi 4/8/20181:14 PM

## 2016-12-13 NOTE — Progress Notes (Signed)
Hematology/Oncology Consult note [facility] [phone] [phone]  Patient Care Team: Gayland Curry, MD as PCP - General (Family Medicine) Robert Bellow, MD (General Surgery) Gayland Curry, MD as Consulting Physician (Family Medicine) Evaristo Bury, MD as Referring Physician (Vascular Surgery) Anthonette Legato, MD (Internal Medicine)   Name of the patient: Whitney Woods  [id number]  Oct 18, 1924    Reason for consult: peritoneal carcinomatosis seen on CT   Requesting physician: Dr. Tressia Miners  Date of visit: 12/13/2016    History of presenting illness- Patient is a 83 yr old female who was seen by DR. Finnegan at [facility] clinic on 12/30/2016 for peritoneal carcinomatosis noted on CT scan. She had presented to Er with symptoms of abdominal pain and distension on 11/23/16. CT showed diffuse omental thickening suspicious for peritoneal carcinomatosis. Dr. Grayland Ormond had arranged CT guided biopsy of omentum as an outpatient and to discuss further management options thereafter. Baseline BMP done in the office showed potassium of 7 and creatinine of 3.6. Patient was asked to go to ER. She has received 2 sessions of dialysis so far and potassium has come down to 5.4. Creatinine remains elevated at 3. CA 125 from 12/29/2016 elevated at 2804 highly suspicious for ovarian cancer.   Currently reports fatigue and abdominal distension and pain  ECOG PS- 2  Pain scale- 4   Review of systems- Review of Systems  Constitutional: Positive for malaise/fatigue. Negative for chills, fever and weight loss.  HENT: Negative for congestion, ear discharge and nosebleeds.   Eyes: Negative for blurred vision.  Respiratory: Negative for cough, hemoptysis, sputum production, shortness of breath and wheezing.   Cardiovascular: Negative for chest pain, palpitations, orthopnea and claudication.  Gastrointestinal: Positive for abdominal pain. Negative for blood in stool,  constipation, diarrhea, heartburn, melena, nausea and vomiting.       Abdominal distension +  Genitourinary: Negative for dysuria, flank pain, frequency, hematuria and urgency.  Musculoskeletal: Negative for back pain, joint pain and myalgias.  Skin: Negative for rash.  Neurological: Negative for dizziness, tingling, focal weakness, seizures, weakness and headaches.  Endo/Heme/Allergies: Does not bruise/bleed easily.  Psychiatric/Behavioral: Negative for depression and suicidal ideas. The patient does not have insomnia.     No Known Allergies  Patient Active Problem List   Diagnosis Date Noted  . Peritoneal carcinomatosis ([place]) 12/28/2016  . Hyperkalemia 12/19/2016  . AP (abdominal pain) 02/07/2016  . Acute renal failure (ARF) ([place]) 09/26/2015  . Choledocholithiasis 03/06/2014  . Personal history of breast cancer 05/10/2013     Past Medical History:  Diagnosis Date  . Anemia   . Arthritis 10 years  . CHF (congestive heart failure) (HCC)    Dr Nehemiah Massed  . Dizziness   . Heart murmur 10 years  . Hypertension 10 years  . Malignant neoplasm of lower-inner quadrant of female breast ([place]) 2012   Invasive lobular carcinoma of the left breast, LCIS. 2.2 cm. Negative sentinel node. ER/PR negative., HER-2/neu nonamplified.  . Personal history of radiation therapy   . Thyroid disease      Past Surgical History:  Procedure Laterality Date  . ABDOMINAL HYSTERECTOMY  age 60    partial  . APPENDECTOMY  age 55  . BREAST BIOPSY Left 2012   +  . BREAST LUMPECTOMY Left 2012   with radiation  . COLONOSCOPY  2013   [name]  . COSMETIC SURGERY    . ERCP W/ SPHICTEROTOMY  June 2015, Verdie Shire,  MD   Follow-up ultrasound showed  no residual cholelithiasis.  Marland Kitchen EYE SURGERY Bilateral   . gallstones removed  03/04/14   ERCP  . UPPER GI ENDOSCOPY  2013    Social History   Social History  . Marital status: Widowed    Spouse name: N/A  . Number of children: N/A  . Years of education: N/A    Occupational History  . Not on file.   Social History Main Topics  . Smoking status: Never Smoker  . Smokeless tobacco: Never Used  . Alcohol use No  . Drug use: No  . Sexual activity: No   Other Topics Concern  . Not on file   Social History Narrative  . No narrative on file     Family History  Problem Relation Age of Onset  . Breast cancer Maternal Grandmother 46  . Breast cancer Mother   . Diabetes Mother   . Heart disease Mother   . Stroke Father      Current Facility-Administered Medications:  .  0.9 %  sodium chloride infusion, , Intravenous, Continuous, Epifanio Lesches, MD, Last Rate: 75 mL/hr at 12/13/16 0213 .  diphenhydrAMINE (BENADRYL) capsule 25 mg, 25 mg, Oral, QHS PRN, Lance Coon, MD, 25 mg at 12/24/2016 2355 .  docusate sodium (COLACE) capsule 100 mg, 100 mg, Oral, Daily PRN, Gladstone Lighter, MD .  ferrous sulfate tablet 325 mg, 325 mg, Oral, Once per day on Mon Wed Fri, Snehalatha Konidena, MD .  heparin injection 5,000 Units, 5,000 Units, Subcutaneous, Q8H, Epifanio Lesches, MD .  levothyroxine (SYNTHROID, LEVOTHROID) tablet 75 mcg, 75 mcg, Oral, QAC breakfast, Epifanio Lesches, MD, 75 mcg at 12/13/16 0842 .  metoprolol succinate (TOPROL-XL) 24 hr tablet 25 mg, 25 mg, Oral, BID, Epifanio Lesches, MD, 25 mg at 12/13/16 0842 .  nystatin (MYCOSTATIN/NYSTOP) topical powder, , Topical, BID, Gladstone Lighter, MD .  ondansetron (ZOFRAN) tablet 4 mg, 4 mg, Oral, Q6H PRN **OR** ondansetron (ZOFRAN) injection 4 mg, 4 mg, Intravenous, Q6H PRN, Epifanio Lesches, MD .  oxybutynin (DITROPAN) tablet 5 mg, 5 mg, Oral, BID, Epifanio Lesches, MD, 5 mg at 12/13/16 0843 .  oxyCODONE-acetaminophen (PERCOCET/ROXICET) 5-325 MG per tablet 1 tablet, 1 tablet, Oral, Q8H PRN, Lance Coon, MD, 1 tablet at 12/13/16 [phone] .  pantoprazole (PROTONIX) EC tablet 40 mg, 40 mg, Oral, Daily, Epifanio Lesches, MD, 40 mg at 12/13/16 0842 .  patiromer Daryll Drown) packet  8.4 g, 8.4 g, Oral, Daily, Gladstone Lighter, MD, 8.4 g at 12/13/16 1127 .  vitamin B-12 (CYANOCOBALAMIN) tablet 1,000 mcg, 1,000 mcg, Oral, Daily, Epifanio Lesches, MD, 1,000 mcg at 12/13/16 0842   Physical exam:  Vitals:   12/12/16 1331 12/12/16 1922 12/13/16 0347 12/13/16 0825  BP: (!) 111/54 (!) 117/49 131/63 (!) 128/45  Pulse: (!) 112 (!) 117 (!) 116 (!) 104  Resp: _0 Temp: 99.3 F (37.4 C) 100 F (37.8 C) 97.6 F (36.4 C) 98.1 F (36.7 C)  TempSrc: Oral Oral  Oral  SpO2: 97% 92% 92% 92%  Weight:   169 lb 11.2 oz (77 kg)    Physical Exam  Constitutional: She is oriented to person, place, and time.  Elderly female in no acute distress  HENT:  Head: Normocephalic and atraumatic.  Eyes: EOM are normal. Pupils are equal, round, and reactive to light.  Neck: Normal range of motion.  Cardiovascular: Regular rhythm and normal heart sounds.   Tachycardic +  Pulmonary/Chest: Effort normal and breath sounds normal.  Abdominal:  Firm, distended  Neurological: She  is alert and oriented to person, place, and time.  Skin: Skin is warm and dry.  Dry scaly skin over b/l LE       CMP Latest Ref Rng & Units 12/13/2016  Glucose 65 - 99 mg/dL 102(H)  BUN 6 - 20 mg/dL 54(H)  Creatinine 0.44 - 1.00 mg/dL 2.37(H)  Sodium 135 - 145 mmol/L 138  Potassium 3.5 - 5.1 mmol/L 5.4(H)  Chloride 101 - 111 mmol/L 110  CO2 22 - 32 mmol/L 18(L)  Calcium 8.9 - 10.3 mg/dL 8.4(L)  Total Protein 6.5 - 8.1 g/dL -  Total Bilirubin 0.3 - 1.2 mg/dL -  Alkaline Phos 38 - 126 U/L -  AST 15 - 41 U/L -  ALT 14 - 54 U/L -   CBC Latest Ref Rng & Units 12/12/2016  WBC 3.6 - 11.0 K/uL 5.9  Hemoglobin 12.0 - 16.0 g/dL 9.9(L)  Hematocrit 35.0 - 47.0 % 30.1(L)  Platelets 150 - 440 K/uL 356    _0 @  Ct Abdomen Pelvis Wo Contrast  Result Date: 11/23/2016 CLINICAL DATA:  Abdominal pain and distention for 2 weeks. Renal insufficiency. Hysterectomy and appendectomy. No history of cancer. EXAM:  CT ABDOMEN AND PELVIS WITHOUT CONTRAST TECHNIQUE: Multidetector CT imaging of the abdomen and pelvis was performed following the standard protocol without IV contrast. COMPARISON:  Renal ultrasound 09/26/2015.  CT 02/22/2014 FINDINGS: Lower chest: Clear lung bases. Mild cardiomegaly with lad coronary artery atherosclerosis. Small hiatal hernia. Hepatobiliary: Marked hepatic steatosis without gross liver lesion. Normal gallbladder. Common duct is upper normal for age, 10 mm on image 27/series 2. No calcified stones. Pancreas: Normal pancreas for age, without duct dilatation. Spleen: Normal in size, without focal abnormality. Adrenals/Urinary Tract: Normal adrenal glands. Mild renal cortical thinning. No hydronephrosis. Normal urinary bladder. Stomach/Bowel: Normal distal stomach. Proximal gastric underdistention. Scattered colonic diverticula. Normal terminal ileum. Normal small bowel caliber. Vascular/Lymphatic: Aortic and branch vessel atherosclerosis. No abdominopelvic adenopathy. Reproductive: Hysterectomy.  No adnexal mass. Other: Moderate pelvic floor laxity. Small volume abdominopelvic ascites. Extensive soft tissue thickening throughout the omentum, including on image 42/series 2. No well-defined dominant omental mass. Musculoskeletal: Lumbosacral spondylosis. IMPRESSION: 1. Small volume abdominopelvic ascites with extensive relatively diffuse omental thickening. Findings are suspicious for carcinomatosis. No primary malignancy identified. 2. Hepatic steatosis. 3. Hiatal hernia. 4.  Coronary artery atherosclerosis. Aortic atherosclerosis. 5. Pelvic floor laxity. These results will be called to the ordering clinician or representative by the Radiologist Assistant, and communication documented in the PACS or zVision Dashboard. Electronically Signed   By: Abigail Miyamoto M.D.   On: 11/23/2016 19:56    Assessment and plan- Patient is a 83 y.o. female admitted for hyperkalemia found to have peritneal  carcinomatosis on CT  Patient has significant abdominal distension and along with features of peritoneal carcinomatosis and elevated CA 125, this is highly suspicious for gyn primary malignancy like ovarian cancer. Plan for CT guided omentum biopsy tomorrow. Need to speak to IR if this would be feasible. DR. Grayland Ormond to discuss management options with patient and family after biopsy results are back.   AKI/ ATN- from medication/ dehydration. Nephrology following. s/p 2 sessions of dialysis  Dr. Grayland Ormond will follow patient tomorrow   Visit Diagnosis 1. Acute hyperkalemia   2. AKI (acute kidney injury) ([place])   3. 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 Randa Evens, MD, MPH [facility] Pager- [phone] 12/13/2016  3:43 PM

## 2016-12-13 NOTE — Progress Notes (Signed)
Informed patient of order to place foley catheter for strict I/Os. Patient hesitant to have foley catheter placed at this time. Patient said she would like to think about it for a little bit and will let me know what she decides. Will check back with patient and see if she is agreeable and if so will proceed with procedure.

## 2016-12-13 NOTE — Progress Notes (Signed)
Patients temporary dialysis catheter oozing small amount of blood. MD notified. Orders to change dressing and put gauze over to more closely monitor amount of drainage. Will change dressing and continue to monitor patient closely.

## 2016-12-14 ENCOUNTER — Inpatient Hospital Stay: Payer: Medicare Other

## 2016-12-14 DIAGNOSIS — R0602 Shortness of breath: Secondary | ICD-10-CM

## 2016-12-14 DIAGNOSIS — I10 Essential (primary) hypertension: Secondary | ICD-10-CM

## 2016-12-14 LAB — CBC
HEMATOCRIT: 30.9 % — AB (ref 35.0–47.0)
HEMOGLOBIN: 9.8 g/dL — AB (ref 12.0–16.0)
MCH: 27.7 pg (ref 26.0–34.0)
MCHC: 31.7 g/dL — AB (ref 32.0–36.0)
MCV: 87.3 fL (ref 80.0–100.0)
Platelets: 304 10*3/uL (ref 150–440)
RBC: 3.54 MIL/uL — ABNORMAL LOW (ref 3.80–5.20)
RDW: 14.8 % — ABNORMAL HIGH (ref 11.5–14.5)
WBC: 8.1 10*3/uL (ref 3.6–11.0)

## 2016-12-14 LAB — BASIC METABOLIC PANEL
ANION GAP: 5 (ref 5–15)
BUN: 47 mg/dL — ABNORMAL HIGH (ref 6–20)
CALCIUM: 8.6 mg/dL — AB (ref 8.9–10.3)
CHLORIDE: 110 mmol/L (ref 101–111)
CO2: 22 mmol/L (ref 22–32)
Creatinine, Ser: 1.85 mg/dL — ABNORMAL HIGH (ref 0.44–1.00)
GFR calc Af Amer: 27 mL/min — ABNORMAL LOW (ref >60)
GFR calc non Af Amer: 23 mL/min — ABNORMAL LOW (ref >60)
Glucose, Bld: 109 mg/dL — ABNORMAL HIGH (ref 65–99)
Potassium: 4.9 mmol/L (ref 3.5–5.1)
Sodium: 137 mmol/L (ref 135–145)

## 2016-12-14 LAB — GLUCOSE, CAPILLARY: Glucose-Capillary: 103 mg/dL — ABNORMAL HIGH (ref 65–99)

## 2016-12-14 LAB — CA 27.29 (SERIAL MONITOR): CA 27.29: 175.9 U/mL — AB (ref 0.0–38.6)

## 2016-12-14 LAB — CA 19-9 (SERIAL): CA 19-9: 20 U/mL (ref 0–35)

## 2016-12-14 MED ORDER — OXYCODONE-ACETAMINOPHEN 5-325 MG PO TABS
1.0000 | ORAL_TABLET | Freq: Three times a day (TID) | ORAL | Status: DC | PRN
  Administered 2016-12-14 – 2016-12-15 (×3): 1 via ORAL
  Filled 2016-12-14 (×3): qty 1

## 2016-12-14 MED ORDER — ACETAMINOPHEN 325 MG PO TABS
650.0000 mg | ORAL_TABLET | Freq: Four times a day (QID) | ORAL | Status: DC | PRN
  Administered 2016-12-14: 650 mg via ORAL
  Filled 2016-12-14: qty 2

## 2016-12-14 MED ORDER — MIDAZOLAM HCL 5 MG/5ML IJ SOLN
INTRAMUSCULAR | Status: AC
  Filled 2016-12-14: qty 5

## 2016-12-14 MED ORDER — FENTANYL CITRATE (PF) 100 MCG/2ML IJ SOLN
INTRAMUSCULAR | Status: AC
  Filled 2016-12-14: qty 4

## 2016-12-14 NOTE — Progress Notes (Signed)
Patients HR uncontrolled from 100-130's, also having frequent PVCs. MD notified, EKG ordered and order to give PM dose of metoprolol early. Will get EKG and give metoprolol and continue to monitor.

## 2016-12-14 NOTE — Progress Notes (Signed)
EKG performed. Results called to MD. Metoprolol given. Patient HR currently in 90's. No new orders at this time, will continue to monitor.

## 2016-12-14 NOTE — Progress Notes (Signed)
[facility] [place] NAME: Whitney Woods    MR#:  [id number]  DATE OF BIRTH:  20-Nov-1924  SUBJECTIVE:  CHIEF COMPLAINT:   Chief Complaint  Patient presents with  . hyperkalemia   - complains of diffuse pain all over abdomen, not comfortable - agreeable for CT guided biopsy today  REVIEW OF SYSTEMS:  Review of Systems  Constitutional: Positive for malaise/fatigue. Negative for chills and fever.  HENT: Positive for hearing loss. Negative for ear discharge, ear pain and nosebleeds.   Eyes: Negative for blurred vision and double vision.  Respiratory: Negative for cough, shortness of breath and wheezing.   Cardiovascular: Negative for chest pain, palpitations and leg swelling.  Gastrointestinal: Positive for abdominal pain and diarrhea. Negative for constipation, nausea and vomiting.  Genitourinary: Negative for dysuria.       Incontinence  Musculoskeletal: Negative for myalgias.  Neurological: Negative for dizziness, speech change, focal weakness, seizures and headaches.  Psychiatric/Behavioral: Negative for depression.    DRUG ALLERGIES:  No Known Allergies  VITALS:  Blood pressure 132/60, pulse (!) 112, temperature 99.1 F (37.3 C), temperature source Oral, resp. rate 16, weight 78.2 kg (172 lb 8 oz), SpO2 94 %.  PHYSICAL EXAMINATION:  Physical Exam  GENERAL:  83 y.o.-year-old patient lying in the bed with no acute distress. Hard of hearing EYES: Pupils equal, round, reactive to light and accommodation. No scleral icterus. Extraocular muscles intact.  HEENT: Head atraumatic, normocephalic. Oropharynx and nasopharynx clear.  NECK:  Supple, no jugular venous distention. No thyroid enlargement, no tenderness.  LUNGS: Normal breath sounds bilaterally, no wheezing, rales,rhonchi or crepitation. No use of accessory muscles of respiration. Decreased bibasilar breath sounds CARDIOVASCULAR: S1, S2 normal. No murmurs, rubs, or gallops.    ABDOMEN: Soft, but distended, diffusely tender- mostly in the left flank region. Bowel sounds present. No organomegaly or mass.  EXTREMITIES: No pedal edema, cyanosis, or clubbing. Very scaly lower extremity skin changes Right groin temporary dialysis catheter present NEUROLOGIC: Cranial nerves II through XII are intact. Muscle strength 5/5 in all extremities. Sensation intact. Gait not checked.  PSYCHIATRIC: The patient is alert and oriented x 3.  SKIN: No obvious rash, lesion, or ulcer.    LABORATORY PANEL:   CBC  Recent Labs Lab 12/14/16 0250  WBC 8.1  HGB 9.8*  HCT 30.9*  PLT 304   ------------------------------------------------------------------------------------------------------------------  Chemistries   Recent Labs Lab 01/02/2017 1723  12/14/16 0250  NA 137  < > 137  K 6.6*  < > 4.9  CL 112*  < > 110  CO2 15*  < > 22  GLUCOSE 134*  < > 109*  BUN 97*  < > 47*  CREATININE 3.68*  < > 1.85*  CALCIUM 9.0  < > 8.6*  AST 18  --   --   ALT 13*  --   --   ALKPHOS 51  --   --   BILITOT 0.5  --   --   < > = values in this interval not displayed. ------------------------------------------------------------------------------------------------------------------  Cardiac Enzymes No results for input(s): TROPONINI in the last 168 hours. ------------------------------------------------------------------------------------------------------------------  RADIOLOGY:  No results found.  EKG:   Orders placed or performed during the hospital encounter of 12/08/2016  . ED EKG  . ED EKG    ASSESSMENT AND PLAN:   83 year old female with past medical history significant for hypertension, anemia, history of breast cancer status post lumpectomy about 6 years ago, sent in from  oncology office for potassium of 7.  #1 hyperkalemia-secondary to renal failure and also was on lisinopril as outpatient. -received emergency dialysis for  2 sessions. Potassium is improving. Received  veltasa yesterday- being discontinued today.  - No dialysis today.   #2 acute renal failure on CK D-baseline CK D stage III with creatinine of 1.5. Currently creatinine is at 1.8 after 2 sessions of dialysis. -Management per nephrology. - not a candidate likely for permanent dialysis especially since family were not too sure about chemotherapy. Needs to discuss with family.  - no need for dialysis today, leave the temporary catheter in for now  #3 peritoneal carcinomatosis-abdominal distention with nausea and poor by mouth intake going on for a few weeks now. -CT of the abdomen was done in the emergency room 3 weeks ago for diarrhea and nausea vomiting which showed the omental mets. -Patient states she had a colonoscopy in the past which was completely normal. No source of primary is identified at this time. -Oncology consulted. Tumor markers with elevated CA 125- likely ovarian cancer or peritoneal carcinomatosis  - IR fr biopsy today- palliative care consulted for goals of care- about chemotherapy and dialysis if needed  #4 hypertension and sinus tachycardia-continue Toprol  #5 chronic diastolic heart failure-currently Lasix is on hold due to acute renal failure. Monitor closely. No acute exacerbation.  #6 DVT prophylaxis-on subcutaneous heparin- discontinued today for biopsy   Physical therapy after the temporary catheter is removed    All the records are reviewed and case discussed with Care Management/Social Workerr. Management plans discussed with the patient, family and they are in agreement.  CODE STATUS: Full code  TOTAL TIME TAKING CARE OF THIS PATIENT: 36 minutes.   POSSIBLE D/C IN 2 DAYS, DEPENDING ON CLINICAL CONDITION.   Gladstone Lighter M.D on 12/14/2016 at 10:31 AM  Between 7am to 6pm - Pager - [phone]  After 6pm go to [email or web address] - password [email or web address] [facility]  Office  [phone]  CC: Primary care physician; Gayland Curry,  MD

## 2016-12-14 NOTE — Plan of Care (Signed)
Problem: Activity: Goal: Risk for activity intolerance will decrease Outcome: Not Progressing Patient has abdominal and back pain. Patient is currently on bedrest due to temporary trialysis site in the right femoral.

## 2016-12-14 NOTE — Care Management (Signed)
Patient in early stages of Abdominal carcinomatosis work up.  Sent to ED from her oncologist for critical potassium level of 7- thought to be due to prolonged dehydration.  Emergent dialysis.  Notified Elvera Bicker with [employer]

## 2016-12-14 NOTE — Progress Notes (Signed)
[facility]  ROUNDING NOTE   Subjective:   CT biopsy for today.   K 4.9 on veltassa  Creatinine 1.85 (2.37)  Objective:  Vital signs in last 24 hours:  Temp:  [97.8 F (36.6 C)-99.1 F (37.3 C)] 99.1 F (37.3 C) (04/09 0718) Pulse Rate:  [97-115] 112 (04/09 0718) Resp:  [16-18] 16 (04/09 0718) BP: (111-135)/(60-74) 132/60 (04/09 0718) SpO2:  [92 %-94 %] 94 % (04/09 0718) Weight:  [78.2 kg (172 lb 8 oz)] 78.2 kg (172 lb 8 oz) (04/09 0418)  Weight change: 1.146 kg (2 lb 8.4 oz) Filed Weights   12/12/16 1249 12/13/16 0347 12/14/16 0418  Weight: 77.1 kg (169 lb 15.6 oz) 77 kg (169 lb 11.2 oz) 78.2 kg (172 lb 8 oz)    Intake/Output: I/O last 3 completed shifts: In: 3408.8 [P.O.:480; I.V.:2928.8] Out: 2475 [Urine:2475]   Intake/Output this shift:  No intake/output data recorded.  Physical Exam: General: No acute distress  Head: Normocephalic, atraumatic. Moist oral mucosal membranes  Eyes: Anicteric  Neck: Supple, trachea midline  Lungs:  Clear to auscultation, normal effort  Heart: S1S2 no rubs  Abdomen:  Soft, mild diffuse tenderness, distension noted  Extremities: no peripheral edema.  Neurologic: Nonfocal, moving all four extremities  Skin: No lesions  Access: Temporary right femoral dialysis catheter    Basic Metabolic Panel:  Recent Labs Lab 12/23/2016 1723 12/24/2016 1839 12/20/2016 2043 12/20/2016 2230 12/12/16 0439 12/13/16 0456 12/14/16 0250  NA 137 139  --   --  139 138 137  K 6.6* 5.9*  --  4.8 5.4* 5.4* 4.9  CL 112* 112*  --   --  110 110 110  CO2 15* 18*  --   --  23 18* 22  GLUCOSE 134* 117*  --   --  95 102* 109*  BUN 97* 96*  --   --  72* 54* 47*  CREATININE 3.68* 3.78*  --   --  3.08* 2.37* 1.85*  CALCIUM 9.0 10.1  --   --  8.7* 8.4* 8.6*  PHOS  --   --  4.2  --  4.9*  --   --     Liver Function Tests:  Recent Labs Lab 12/31/2016 1556 01/03/2017 1723  AST 18 18  ALT 15 13*  ALKPHOS 56 51  BILITOT 0.3 0.5  PROT 7.2 6.9   ALBUMIN 3.0* 2.9*   No results for input(s): LIPASE, AMYLASE in the last 168 hours. No results for input(s): AMMONIA in the last 168 hours.  CBC:  Recent Labs Lab 12/30/2016 1556 12/20/2016 1723 12/12/16 0439 12/14/16 0250  WBC 7.8 7.1 5.9 8.1  NEUTROABS 5.6 5.3  --   --   HGB 11.1* 10.8* 9.9* 9.8*  HCT 35.2 33.8* 30.1* 30.9*  MCV 88.1 87.9 87.0 87.3  PLT 477* 443* 356 304    Cardiac Enzymes: No results for input(s): CKTOTAL, CKMB, CKMBINDEX, TROPONINI in the last 168 hours.  BNP: Invalid input(s): POCBNP  CBG:  Recent Labs Lab 12/23/2016 1733 12/12/16 0808 12/13/16 0742 12/14/16 0820  GLUCAP 228* 101* 135* 103*    Microbiology: Results for orders placed or performed during the hospital encounter of 12/18/2016  MRSA PCR Screening     Status: None   Collection Time: 12/21/2016 11:27 PM  Result Value Ref Range Status   MRSA by PCR NEGATIVE NEGATIVE Final    Comment:        The GeneXpert MRSA Assay (FDA approved for NASAL specimens only), is one  component of a comprehensive MRSA colonization surveillance program. It is not intended to diagnose MRSA infection nor to guide or monitor treatment for MRSA infections.     Coagulation Studies:  Recent Labs  12/17/2016 1801  LABPROT 13.6  INR 1.04    Urinalysis: No results for input(s): COLORURINE, LABSPEC, PHURINE, GLUCOSEU, HGBUR, BILIRUBINUR, KETONESUR, PROTEINUR, UROBILINOGEN, NITRITE, LEUKOCYTESUR in the last 72 hours.  Invalid input(s): APPERANCEUR    Imaging: No results found.   Medications:   . sodium chloride 75 mL/hr at 12/14/16 0620   . ferrous sulfate  325 mg Oral Once per day on Mon Wed Fri  . levothyroxine  75 mcg Oral QAC breakfast  . metoprolol succinate  25 mg Oral BID  . nystatin   Topical BID  . oxybutynin  5 mg Oral BID  . pantoprazole  40 mg Oral Daily  . patiromer  8.4 g Oral Daily  . vitamin B-12  1,000 mcg Oral Daily   diphenhydrAMINE, docusate sodium, ondansetron **OR**  ondansetron (ZOFRAN) IV, oxyCODONE-acetaminophen  Assessment/ Plan:  83 y.o. white female with abdominal carcinomatosis, vitamin D deficiency, lower extremity edema, hypothyroidism, hypertension, hyperlipidemia, chronic urinary frequency, who was admitted to [facility] on 12/17/2016 for evaluation of recently diagnosed abdominal carcinomatosis, acute renal failure, and severe hyperkalemia.  1.  Acute renal failure with hyperkalemia on chronic kidney disease stage III   Acute renal failure due to deydration, lisinopril, furosemide.  Baseline creatinine 1.5 11/23/16 - Discontinue veltassa - Continue IV fluids  2. Anemia with renal failure: hemoglobin 9.8   LOS: 3 Whitney Woods 4/9/20189:58 AM

## 2016-12-14 NOTE — Progress Notes (Signed)
[place]  [phone] [phone]  ID: Edwinna Areola Ord OB: 01-08-1925  MR#: [id number]  BSW#:[id number]  Patient Care Team: Gayland Curry, MD as PCP - General (Family Medicine) Robert Bellow, MD (General Surgery) Gayland Curry, MD as Consulting Physician (Family Medicine) Evaristo Bury, MD as Referring Physician (Vascular Surgery) Anthonette Legato, MD (Internal Medicine)  CHIEF COMPLAINT: Peritoneal carcinomatosis, acute renal failure.  INTERVAL HISTORY: Patient lying in bed comfortably with family at bedside. She received dialysis over the weekend, but has elected to pursue no further treatment. She continues to have abdominal bloating and pain, but otherwise feels well.  REVIEW OF SYSTEMS:   Review of Systems  Constitutional: Positive for malaise/fatigue. Negative for fever and weight loss.  Respiratory: Positive for shortness of breath. Negative for cough.   Cardiovascular: Negative.  Negative for chest pain and leg swelling.  Gastrointestinal: Positive for abdominal pain. Negative for blood in stool, constipation, diarrhea, melena, nausea and vomiting.  Genitourinary: Negative.   Musculoskeletal: Negative.   Neurological: Positive for weakness.  Psychiatric/Behavioral: Negative.  The patient is not nervous/anxious.     As per HPI. Otherwise, a complete review of systems is negative.  PAST MEDICAL HISTORY: Past Medical History:  Diagnosis Date  . Anemia   . Arthritis 10 years  . CHF (congestive heart failure) (HCC)    Dr Nehemiah Massed  . Dizziness   . Heart murmur 10 years  . Hypertension 10 years  . Malignant neoplasm of lower-inner quadrant of female breast ([place]) 2012   Invasive lobular carcinoma of the left breast, LCIS. 2.2 cm. Negative sentinel node. ER/PR negative., HER-2/neu nonamplified.  . Personal history of radiation therapy   . Thyroid disease     PAST SURGICAL HISTORY: Past Surgical History:  Procedure  Laterality Date  . ABDOMINAL HYSTERECTOMY  age 83    partial  . APPENDECTOMY  age 83  . BREAST BIOPSY Left 2012   +  . BREAST LUMPECTOMY Left 2012   with radiation  . COLONOSCOPY  2013   [name]  . COSMETIC SURGERY    . ERCP W/ SPHICTEROTOMY  June 2015, Verdie Shire,  MD   Follow-up ultrasound showed no residual cholelithiasis.  Marland Kitchen EYE SURGERY Bilateral   . gallstones removed  03/04/14   ERCP  . UPPER GI ENDOSCOPY  2013    FAMILY HISTORY: Family History  Problem Relation Age of Onset  . Breast cancer Maternal Grandmother 28  . Breast cancer Mother   . Diabetes Mother   . Heart disease Mother   . Stroke Father     ADVANCED DIRECTIVES (Y/N):  _0 @  HEALTH MAINTENANCE: Social History  Substance Use Topics  . Smoking status: Never Smoker  . Smokeless tobacco: Never Used  . Alcohol use No     Colonoscopy:  PAP:  Bone density:  Lipid panel:  No Known Allergies  Current Facility-Administered Medications  Medication Dose Route Frequency Provider Last Rate Last Dose  . 0.9 %  sodium chloride infusion   Intravenous Continuous Epifanio Lesches, MD 75 mL/hr at 12/14/16 2020    . acetaminophen (TYLENOL) tablet 650 mg  650 mg Oral Q6H PRN Gladstone Lighter, MD      . diphenhydrAMINE (BENADRYL) capsule 25 mg  25 mg Oral QHS PRN Lance Coon, MD   25 mg at 01/03/2017 2355  . docusate sodium (COLACE) capsule 100 mg  100 mg Oral Daily PRN Gladstone Lighter, MD      . ferrous sulfate tablet 325 mg  325 mg Oral Once per day on Mon Wed Fri Epifanio Lesches, MD   325 mg at 12/14/16 4287  . levothyroxine (SYNTHROID, LEVOTHROID) tablet 75 mcg  75 mcg Oral QAC breakfast Epifanio Lesches, MD   75 mcg at 12/14/16 0806  . metoprolol succinate (TOPROL-XL) 24 hr tablet 25 mg  25 mg Oral BID Epifanio Lesches, MD   25 mg at 12/14/16 1849  . nystatin (MYCOSTATIN/NYSTOP) topical powder   Topical BID Gladstone Lighter, MD      . ondansetron [facility]) tablet 4 mg  4 mg Oral Q6H PRN Epifanio Lesches, MD       Or  . ondansetron (ZOFRAN) injection 4 mg  4 mg Intravenous Q6H PRN Epifanio Lesches, MD      . oxybutynin (DITROPAN) tablet 5 mg  5 mg Oral BID Epifanio Lesches, MD   5 mg at 12/14/16 2151  . oxyCODONE-acetaminophen (PERCOCET/ROXICET) 5-325 MG per tablet 1 tablet  1 tablet Oral Q8H PRN Gladstone Lighter, MD   1 tablet at 12/14/16 1849  . pantoprazole (PROTONIX) EC tablet 40 mg  40 mg Oral Daily Epifanio Lesches, MD   40 mg at 12/14/16 0806  . vitamin B-12 (CYANOCOBALAMIN) tablet 1,000 mcg  1,000 mcg Oral Daily Epifanio Lesches, MD   1,000 mcg at 12/14/16 0807    OBJECTIVE: Vitals:   12/14/16 1848 12/14/16 1933  BP: (!) 136/58 120/90  Pulse: (!) 101 81  Resp:  18  Temp:  97.6 F (36.4 C)     Body mass index is 31.55 kg/m.    ECOG FS:3 - Symptomatic, >50% confined to bed  General: Well-developed, well-nourished, no acute distress. Eyes: Pink conjunctiva, anicteric sclera. HEENT: Normocephalic, moist mucous membranes, clear oropharnyx. Lungs: Clear to auscultation bilaterally. Heart: Regular rate and rhythm. No rubs, murmurs, or gallops. Abdomen: Distended, nontender. Musculoskeletal: No edema, cyanosis, or clubbing. Neuro: Alert, answering all questions appropriately. Cranial nerves grossly intact. Skin: No rashes or petechiae noted. Psych: Normal affect.   LAB RESULTS:  Lab Results  Component Value Date   NA 137 12/14/2016   K 4.9 12/14/2016   CL 110 12/14/2016   CO2 22 12/14/2016   GLUCOSE 109 (H) 12/14/2016   BUN 47 (H) 12/14/2016   CREATININE 1.85 (H) 12/14/2016   CALCIUM 8.6 (L) 12/14/2016   PROT 6.9 12/17/2016   ALBUMIN 2.9 (L) 12/15/2016   AST 18 12/31/2016   ALT 13 (L) 12/19/2016   ALKPHOS 51 12/18/2016   BILITOT 0.5 12/06/2016   GFRNONAA 23 (L) 12/14/2016   GFRAA 27 (L) 12/14/2016    Lab Results  Component Value Date   WBC 8.1 12/14/2016   NEUTROABS 5.3 12/15/2016   HGB 9.8 (L) 12/14/2016   HCT 30.9 (L) 12/14/2016    MCV 87.3 12/14/2016   PLT 304 12/14/2016   Lab Results  Component Value Date   CA125 2,580.0 (H) 12/12/2016     STUDIES: Ct Abdomen Pelvis Wo Contrast  Result Date: 11/23/2016 CLINICAL DATA:  Abdominal pain and distention for 2 weeks. Renal insufficiency. Hysterectomy and appendectomy. No history of cancer. EXAM: CT ABDOMEN AND PELVIS WITHOUT CONTRAST TECHNIQUE: Multidetector CT imaging of the abdomen and pelvis was performed following the standard protocol without IV contrast. COMPARISON:  Renal ultrasound 09/26/2015.  CT 02/22/2014 FINDINGS: Lower chest: Clear lung bases. Mild cardiomegaly with lad coronary artery atherosclerosis. Small hiatal hernia. Hepatobiliary: Marked hepatic steatosis without gross liver lesion. Normal gallbladder. Common duct is upper normal for age, 10 mm on image 27/series 2. No  calcified stones. Pancreas: Normal pancreas for age, without duct dilatation. Spleen: Normal in size, without focal abnormality. Adrenals/Urinary Tract: Normal adrenal glands. Mild renal cortical thinning. No hydronephrosis. Normal urinary bladder. Stomach/Bowel: Normal distal stomach. Proximal gastric underdistention. Scattered colonic diverticula. Normal terminal ileum. Normal small bowel caliber. Vascular/Lymphatic: Aortic and branch vessel atherosclerosis. No abdominopelvic adenopathy. Reproductive: Hysterectomy.  No adnexal mass. Other: Moderate pelvic floor laxity. Small volume abdominopelvic ascites. Extensive soft tissue thickening throughout the omentum, including on image 42/series 2. No well-defined dominant omental mass. Musculoskeletal: Lumbosacral spondylosis. IMPRESSION: 1. Small volume abdominopelvic ascites with extensive relatively diffuse omental thickening. Findings are suspicious for carcinomatosis. No primary malignancy identified. 2. Hepatic steatosis. 3. Hiatal hernia. 4.  Coronary artery atherosclerosis. Aortic atherosclerosis. 5. Pelvic floor laxity. These results will be  called to the ordering clinician or representative by the Radiologist Assistant, and communication documented in the PACS or zVision Dashboard. Electronically Signed   By: Abigail Miyamoto M.D.   On: 11/23/2016 19:56    ASSESSMENT: Peritoneal carcinomatosis, acute renal failure.  PLAN:    1. Peritoneal carcinomatosis: After lengthy discussion with the patient and family, it was determined that no CT-guided biopsy is necessary since they have declined any treatment with chemotherapy. Given the imaging and CA 125 results, it is unlikely a biopsy would reveal a nonmalignant process. Patient and family are considering hospice, but are leaning towards nursing home placement with hospice following. Have consulted and discussed case with palliative care. 2. Acute renal failure: Appreciate nephrology input. No further dialysis. Hospice as above. 3. Hyperkalemia: Improved with dialysis.  Appreciate consult, call with questions. I will be out of town starting Tuesday, December 15, 2016. Please call the oncologist on-call if there are any questions or concerns.   Lloyd Huger, MD   12/14/2016 11:11 PM

## 2016-12-14 NOTE — Plan of Care (Signed)
Problem: Pain Managment: Goal: General experience of comfort will improve Outcome: Not Progressing Patient still has complaints of pain. Pain mediation ordered and given. Will continue to assess and monitor.

## 2016-12-15 DIAGNOSIS — C786 Secondary malignant neoplasm of retroperitoneum and peritoneum: Secondary | ICD-10-CM

## 2016-12-15 DIAGNOSIS — Z7189 Other specified counseling: Secondary | ICD-10-CM

## 2016-12-15 DIAGNOSIS — Z515 Encounter for palliative care: Secondary | ICD-10-CM

## 2016-12-15 DIAGNOSIS — N179 Acute kidney failure, unspecified: Secondary | ICD-10-CM

## 2016-12-15 LAB — CBC
HCT: 31.6 % — ABNORMAL LOW (ref 35.0–47.0)
Hemoglobin: 10.1 g/dL — ABNORMAL LOW (ref 12.0–16.0)
MCH: 27.8 pg (ref 26.0–34.0)
MCHC: 32 g/dL (ref 32.0–36.0)
MCV: 86.7 fL (ref 80.0–100.0)
PLATELETS: 300 10*3/uL (ref 150–440)
RBC: 3.64 MIL/uL — AB (ref 3.80–5.20)
RDW: 14.7 % — AB (ref 11.5–14.5)
WBC: 10.4 10*3/uL (ref 3.6–11.0)

## 2016-12-15 LAB — BASIC METABOLIC PANEL
ANION GAP: 8 (ref 5–15)
BUN: 35 mg/dL — ABNORMAL HIGH (ref 6–20)
CALCIUM: 8.8 mg/dL — AB (ref 8.9–10.3)
CO2: 18 mmol/L — ABNORMAL LOW (ref 22–32)
Chloride: 112 mmol/L — ABNORMAL HIGH (ref 101–111)
Creatinine, Ser: 1.27 mg/dL — ABNORMAL HIGH (ref 0.44–1.00)
GFR, EST AFRICAN AMERICAN: 42 mL/min — AB (ref >60)
GFR, EST NON AFRICAN AMERICAN: 36 mL/min — AB (ref >60)
GLUCOSE: 99 mg/dL (ref 65–99)
Potassium: 4.8 mmol/L (ref 3.5–5.1)
SODIUM: 138 mmol/L (ref 135–145)

## 2016-12-15 LAB — GLUCOSE, CAPILLARY: GLUCOSE-CAPILLARY: 96 mg/dL (ref 65–99)

## 2016-12-15 MED ORDER — POLYVINYL ALCOHOL 1.4 % OP SOLN
1.0000 [drp] | Freq: Four times a day (QID) | OPHTHALMIC | Status: DC | PRN
  Filled 2016-12-15: qty 15

## 2016-12-15 MED ORDER — GLYCOPYRROLATE 0.2 MG/ML IJ SOLN
0.2000 mg | INTRAMUSCULAR | Status: DC | PRN
  Administered 2016-12-16: 0.2 mg via SUBCUTANEOUS
  Filled 2016-12-15 (×2): qty 1

## 2016-12-15 MED ORDER — LORAZEPAM 0.5 MG PO TABS: 0.5000 mg | ORAL_TABLET | ORAL | Status: DC | PRN

## 2016-12-15 MED ORDER — SODIUM CHLORIDE 0.9% FLUSH
3.0000 mL | INTRAVENOUS | Status: DC | PRN
Start: 2016-12-15 — End: 2016-12-16

## 2016-12-15 MED ORDER — MORPHINE 100MG IN NS 100ML (1MG/ML) PREMIX INFUSION
1.0000 mg/h | INTRAVENOUS | Status: DC
  Administered 2016-12-15: 1 mg/h via INTRAVENOUS
  Administered 2016-12-15: 3 mg/h via INTRAVENOUS
  Filled 2016-12-15: qty 100

## 2016-12-15 MED ORDER — SODIUM CHLORIDE 0.9 % IV SOLN: 250.0000 mL | INTRAVENOUS | Status: DC | PRN

## 2016-12-15 MED ORDER — MORPHINE SULFATE (PF) 4 MG/ML IV SOLN
2.0000 mg | INTRAVENOUS | Status: DC | PRN
  Administered 2016-12-15 (×2): 2 mg via INTRAVENOUS
  Filled 2016-12-15 (×2): qty 1

## 2016-12-15 MED ORDER — GLYCOPYRROLATE 1 MG PO TABS
1.0000 mg | ORAL_TABLET | ORAL | Status: DC | PRN
  Filled 2016-12-15: qty 1

## 2016-12-15 MED ORDER — MORPHINE 100MG IN NS 100ML (1MG/ML) PREMIX INFUSION
1.0000 mg/h | INTRAVENOUS | Status: DC
  Filled 2016-12-15: qty 100

## 2016-12-15 MED ORDER — HALOPERIDOL LACTATE 5 MG/ML IJ SOLN: 0.5000 mg | INTRAMUSCULAR | Status: DC | PRN

## 2016-12-15 MED ORDER — GLYCOPYRROLATE 0.2 MG/ML IJ SOLN
0.2000 mg | INTRAMUSCULAR | Status: DC | PRN
  Filled 2016-12-15: qty 1

## 2016-12-15 MED ORDER — MORPHINE SULFATE (PF) 4 MG/ML IV SOLN
2.0000 mg | Freq: Once | INTRAVENOUS | Status: AC
Start: 2016-12-15 — End: 2016-12-15
  Administered 2016-12-15: 2 mg via INTRAVENOUS
  Filled 2016-12-15: qty 1

## 2016-12-15 MED ORDER — OXYCODONE-ACETAMINOPHEN 5-325 MG PO TABS: 1.0000 | ORAL_TABLET | ORAL | Status: DC | PRN

## 2016-12-15 MED ORDER — SODIUM CHLORIDE 0.9 % IV SOLN: 1.0000 mg/h | INTRAVENOUS | Status: DC

## 2016-12-15 MED ORDER — MORPHINE SULFATE (CONCENTRATE) 10 MG/0.5ML PO SOLN
5.0000 mg | ORAL | Status: DC | PRN
  Administered 2016-12-15: 5 mg via SUBLINGUAL
  Filled 2016-12-15 (×2): qty 1

## 2016-12-15 MED ORDER — SODIUM CHLORIDE 0.9% FLUSH: 3.0000 mL | Freq: Two times a day (BID) | INTRAVENOUS | Status: DC

## 2016-12-15 MED ORDER — HALOPERIDOL LACTATE 2 MG/ML PO CONC
0.5000 mg | ORAL | Status: DC | PRN
  Filled 2016-12-15: qty 0.3

## 2016-12-15 MED ORDER — SODIUM CHLORIDE 0.9 % IV SOLN: INTRAVENOUS | Status: DC

## 2016-12-15 MED ORDER — MORPHINE SULFATE (CONCENTRATE) 10 MG/0.5ML PO SOLN: 5.0000 mg | ORAL | Status: DC | PRN

## 2016-12-15 MED ORDER — BIOTENE DRY MOUTH MT LIQD: 15.0000 mL | OROMUCOSAL | Status: DC | PRN

## 2016-12-15 MED ORDER — ENOXAPARIN SODIUM 30 MG/0.3ML ~~LOC~~ SOLN: 30.0000 mg | SUBCUTANEOUS | Status: DC

## 2016-12-15 MED ORDER — HALOPERIDOL 0.5 MG PO TABS: 0.5000 mg | ORAL_TABLET | ORAL | Status: DC | PRN

## 2016-12-15 MED ORDER — MORPHINE BOLUS VIA INFUSION
2.0000 mg | INTRAVENOUS | Status: DC | PRN
  Administered 2016-12-15: 2 mg via INTRAVENOUS
  Filled 2016-12-15: qty 4

## 2016-12-15 NOTE — Progress Notes (Signed)
Comfort measures in place,morphine drip infusing at 3 mg/hr,family at bedside and being comforted

## 2016-12-15 NOTE — Care Management (Signed)
Oncology has met with patient and family. It has been decided not to proceed with biopsy as patient would not choose to have chemo. Temporary dialysis catheter has been removed. Patient is full code.  Per attending, palliative care to consult and will assist with discharge disposition.  CM informed that most likely will choose home with hospice but this has not been confirmed.

## 2016-12-15 NOTE — Progress Notes (Signed)
Patient has complained of lower back pain X3. Administered pain medication and repositioned throughout the shift. Will continue to monitor and assess.

## 2016-12-15 NOTE — Progress Notes (Signed)
[place] at [place] NAME: Whitney Woods    MR#:  [id number]  DATE OF BIRTH:  1925/02/07  SUBJECTIVE:  CHIEF COMPLAINT:   Chief Complaint  Patient presents with  . hyperkalemia   - Patient in general not in great spirits today due to complaints of generalized aches and pains. -Abdomen is distended and hurts and also has low back pain which has not been well controlled. -does not remember talking to the oncologist yesterday.  REVIEW OF SYSTEMS:  Review of Systems  Constitutional: Positive for malaise/fatigue. Negative for chills and fever.  HENT: Positive for hearing loss. Negative for ear discharge, ear pain and nosebleeds.   Eyes: Negative for blurred vision and double vision.  Respiratory: Negative for cough, shortness of breath and wheezing.   Cardiovascular: Negative for chest pain, palpitations and leg swelling.  Gastrointestinal: Positive for abdominal pain. Negative for constipation, diarrhea, nausea and vomiting.  Genitourinary: Negative for dysuria.       Incontinence  Musculoskeletal: Positive for back pain. Negative for myalgias.  Neurological: Negative for dizziness, speech change, focal weakness, seizures and headaches.  Psychiatric/Behavioral: Negative for depression.    DRUG ALLERGIES:  No Known Allergies  VITALS:  Blood pressure 131/68, pulse (!) 58, temperature 99.8 F (37.7 C), temperature source Oral, resp. rate 20, height 5\' 2"  (1.575 m), weight 79.2 kg (174 lb 11.2 oz), SpO2 95 %.  PHYSICAL EXAMINATION:  Physical Exam  GENERAL:  83 y.o.-year-old patient lying in the bed with no acute distress. Hard of hearing EYES: Pupils equal, round, reactive to light and accommodation. No scleral icterus. Extraocular muscles intact.  HEENT: Head atraumatic, normocephalic. Oropharynx and nasopharynx clear.  NECK:  Supple, no jugular venous distention. No thyroid enlargement, no tenderness.  LUNGS: Normal breath sounds  bilaterally, no wheezing, rales,rhonchi or crepitation. No use of accessory muscles of respiration. Decreased bibasilar breath sounds CARDIOVASCULAR: S1, S2 normal. No murmurs, rubs, or gallops.  ABDOMEN: Soft, but distended, diffusely tender- mostly in the left flank region. Bowel sounds present. No organomegaly or mass.  EXTREMITIES: No pedal edema, cyanosis, or clubbing. Very scaly lower extremity skin changes NEUROLOGIC: Cranial nerves II through XII are intact. Muscle strength 5/5 in all extremities. Sensation intact. Gait not checked. Global weakness noted PSYCHIATRIC: The patient is alert and oriented x 3.  SKIN: No obvious rash, lesion, or ulcer.    LABORATORY PANEL:   CBC  Recent Labs Lab 12/15/16 0445  WBC 10.4  HGB 10.1*  HCT 31.6*  PLT 300   ------------------------------------------------------------------------------------------------------------------  Chemistries   Recent Labs Lab 12/18/2016 1723  12/15/16 0445  NA 137  < > 138  K 6.6*  < > 4.8  CL 112*  < > 112*  CO2 15*  < > 18*  GLUCOSE 134*  < > 99  BUN 97*  < > 35*  CREATININE 3.68*  < > 1.27*  CALCIUM 9.0  < > 8.8*  AST 18  --   --   ALT 13*  --   --   ALKPHOS 51  --   --   BILITOT 0.5  --   --   < > = values in this interval not displayed. ------------------------------------------------------------------------------------------------------------------  Cardiac Enzymes No results for input(s): TROPONINI in the last 168 hours. ------------------------------------------------------------------------------------------------------------------  RADIOLOGY:  No results found.  EKG:   Orders placed or performed during the hospital encounter of 12/25/2016  . ED EKG  . ED EKG  . EKG 12-Lead  .  EKG 12-Lead    ASSESSMENT AND PLAN:   83 year old female with past medical history significant for hypertension, anemia, history of breast cancer status post lumpectomy about 6 years ago, sent in from  oncology office for potassium of 7.  #1 hyperkalemia-secondary to renal failure and also was on lisinopril as outpatient. -received emergency dialysis for  2 sessions. Potassium Has improved -. Received veltasa one-time dose  - Renal function and potassium have improved. No further need for dialysis.  #2 acute renal failure on CK D-baseline CK D stage III with creatinine of 1.5.  -Received 2 sessions of dialysis and renal function has improved back to baseline.-Management per nephrology. 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 -CT of the abdomen was done in the emergency room 3 weeks ago for diarrhea and nausea vomiting which showed the omental mets. -Patient states she had a colonoscopy in the past which was completely normal. No source of primary is identified at this time. -Oncology consulted. Tumor markers with elevated CA 125- likely ovarian cancer or peritoneal carcinomatosis  -Patient does not remember talking to the oncologist yesterday, however Dr. Grayland Ormond spoke with patient and family and has discontinued the CT-guided biopsy that was supposed to be done yesterday as they were going more towards conservative approach. According to him,  she is a poor candidate for any workup or treatment. -Palliative care has been consulted due to poor prognosis. Patient hasn't been able to get out of bed while in the hospital. Physical therapy consult is pending. -Might be appropriate for hospice.  #4 hypertension and sinus tachycardia-continue Toprol  #5 chronic diastolic heart failure-currently Lasix is on hold due to acute renal failure. Monitor closely. No acute exacerbation. Discontinue IV fluids today  #6 DVT prophylaxis-on subcutaneous heparin-  changed to Lovenox as her renal function improved. However patient has been refusing heparin injections   Palliative care consult is pending.    All the records are reviewed and case discussed with Care Management/Social Workerr. Management plans discussed with the patient, family and they are in agreement.  CODE STATUS: Full code  TOTAL TIME TAKING CARE OF THIS PATIENT: 36 minutes.   POSSIBLE D/C IN 1-2 DAYS, DEPENDING ON CLINICAL CONDITION.   Gladstone Lighter M.D on 12/15/2016 at 3:02 PM  Between 7am to 6pm - Pager - [phone]  After 6pm go to [email or web address] - password [email or web address] [facility]  Office  [phone]  CC: Primary care physician; Gayland Curry, MD

## 2016-12-15 NOTE — Consult Note (Signed)
Consultation Note Date: 12/15/2016   Patient Name: Whitney Woods  DOB: 1925-03-27  MRN: [id number]  Age / Sex: 83 y.o., female  PCP: Gayland Curry, MD Referring Physician: Gladstone Lighter, MD  Reason for Consultation: Establishing goals of care, Hospice Evaluation, Non pain symptom management, Pain control and Psychosocial/spiritual support  HPI/Patient Profile: 83 y.o. female  with past medical history of CKD, BRCA, CHF, thyroid disease who was admitted on 12/17/2016 with hyperkalemia and kidney failure.  The patient had been feeling poorly for the past 4-5 weeks.  She had blood drawn at her physician's office and was found to have a very high potassium (in the ER it was 7).  She was admitted and underwent 2 dialysis sessions.  Since then a CT abdomen pelvis revealed peritoneal carcinomatosis.  CA 125 was 2800.  Oncology was consulted and advised the patient.  The patient and family decided no further HD, no biopsy, no chemo or radiation.  Today during my conversations with the patient telemetry monitor shows multiple runs of [place], she is gray in color and having increased work of breathing.   Clinical Assessment and Goals of Care: I met at bedside with [name], her son and his significant other Mary.  [name] was having significant back pain that was not present yesterday.  We discussed her symptoms of insomnia and pain.  We talked about her understanding of her cancer and her kidney function.  She answered that she would not be surprised if her time was short because she is having so much pain.  We talked about potentially transferring to a 24 hour care facility focused on her comfort.  Unfortunately we are unable to transfer her today / tonight as she is so uncomfortable.  I explained privately to her son that her heart rhythm is very unstable and she may pass very soon.  Full comfort measures were  chosen by the family.  If she stabilizes tomorrow we will consider a transfer to hospice house.  As an aside the family is working to have a Will amended.  A mobile notary public has been called to come to the hospital.   Primary Decision Maker:  PATIENT and her son.    SUMMARY OF RECOMMENDATIONS    Please be careful not to say "Hospice House" to the patient.  If she leaves the hospital she has been told she is going to a 24 hour care facility that will focus on her comfort.  She understands her time is limited.  Code Status/Advance Care Planning:  DNR    Symptom Management:   Morphine gtt and bolus PRN as needed  Ativan for anxiety or sleep  Haldol for delirium if needed  Robinul for secretions.  Additional Recommendations (Limitations, Scope, Preferences):  Full Comfort Care  Palliative Prophylaxis:   Frequent Pain Assessment, Oral Care and Turn Reposition  Psycho-social/Spiritual:   Desire for further Chaplaincy support: yes   Prognosis:   Hours - Days  Discharge Planning: Anticipated Hospital Death vs transfer to hospice house  will reassess on 4/11      Primary Diagnoses: Present on Admission: . Hyperkalemia   I have reviewed the medical record, interviewed the patient and family, and examined the patient. The following aspects are pertinent.  Past Medical History:  Diagnosis Date  . Anemia   . Arthritis 10 years  . CHF (congestive heart failure) (HCC)    Dr Nehemiah Massed  . Dizziness   . Heart murmur 10 years  . Hypertension 10 years  . Malignant neoplasm of lower-inner quadrant of female breast ([place]) 2012   Invasive lobular carcinoma of the left breast, LCIS. 2.2 cm. Negative sentinel node. ER/PR negative., HER-2/neu nonamplified.  . Personal history of radiation therapy   . Thyroid disease    Social History   Social History  . Marital status: Widowed    Spouse name: N/A  . Number of children: N/A  . Years of education: N/A   Social  History Main Topics  . Smoking status: Never Smoker  . Smokeless tobacco: Never Used  . Alcohol use No  . Drug use: No  . Sexual activity: No   Other Topics Concern  . None   Social History Narrative  . None   Family History  Problem Relation Age of Onset  . Breast cancer Maternal Grandmother 56  . Breast cancer Mother   . Diabetes Mother   . Heart disease Mother   . Stroke Father    Scheduled Meds: . metoprolol succinate  25 mg Oral BID  . nystatin   Topical BID  . oxybutynin  5 mg Oral BID  . sodium chloride flush  3 mL Intravenous Q12H   Continuous Infusions: . morphine     PRN Meds:.sodium chloride, acetaminophen, antiseptic oral rinse, diphenhydrAMINE, docusate sodium, glycopyrrolate **OR** glycopyrrolate **OR** glycopyrrolate, haloperidol **OR** haloperidol **OR** haloperidol lactate, morphine injection, morphine, morphine CONCENTRATE **OR** morphine CONCENTRATE, ondansetron **OR** ondansetron (ZOFRAN) IV, oxyCODONE-acetaminophen, polyvinyl alcohol, sodium chloride flush No Known Allergies Review of Systems  Patient complaining of back pain, anorexia and insomnia  Physical Exam  Well developed female, A&O, subdued, pallor CV rrr Resp mild - mod increased work of breathing. Abdomen soft, no bowel sounds  Vital Signs: BP (!) 102/34   Pulse (!) 55   Temp 99.8 F (37.7 C) (Oral)   Resp 20   Ht 5' 2"  (1.575 m)   Wt 79.2 kg (174 lb 11.2 oz)   SpO2 91%   BMI 31.95 kg/m  Pain Assessment: 0-10 POSS *See Group Information*: 1-Acceptable,Awake and alert Pain Score: 8    SpO2: SpO2: 91 % O2 Device:SpO2: 91 % O2 Flow Rate: .   IO: Intake/output summary:  Intake/Output Summary (Last 24 hours) at 12/15/16 1612 Last data filed at 12/15/16 1024  Gross per 24 hour  Intake           1257.5 ml  Output              250 ml  Net           1007.5 ml    LBM: Last BM Date: 12/13/16 Baseline Weight: Weight: 74.8 kg (165 lb) Most recent weight: Weight: 79.2 kg (174 lb  11.2 oz)     Palliative Assessment/Data:   Flowsheet Rows     Most Recent Value  Intake Tab  Referral Department  Hospitalist  Unit at Time of Referral  Cardiac/Telemetry Unit  Palliative Care Primary Diagnosis  Cancer  Date Notified  12/14/16  Palliative Care Type  New Palliative care  Reason for referral  Clarify Goals of Care, End of Life Care Assistance, Pain, Counsel Regarding Hospice  Date of Admission  12/17/2016  Date first seen by Palliative Care  12/15/16  # of days Palliative referral response time  1 Day(s)  # of days IP prior to Palliative referral  3  Clinical Assessment  Palliative Performance Scale Score  20%  Pain Max last 24 hours  5  Psychosocial & Spiritual Assessment  Palliative Care Outcomes  Patient/Family meeting held?  Yes  Who was at the meeting?  patient, son and son's significant other, [facility]  Palliative Care Outcomes  Improved pain interventions, Clarified goals of care, Counseled regarding hospice, Provided psychosocial or spiritual support, Changed to focus on comfort      Time In: 3:10 Time Out: 4:27 Time Total: 77 min. Greater than 50%  of this time was spent counseling and coordinating care related to the above assessment and plan.  Signed by: Imogene Burn, PA-C Palliative Medicine Pager: [phone]  Please contact Palliative Medicine Team phone at [phone] for questions and concerns.  For individual provider: See Shea Evans

## 2016-12-15 NOTE — Progress Notes (Signed)
Spoke to [place] about condition of patient. Patient is alert and oriented but ashen in color.  Having runs of [place] on monitor.  Vital signs obtained.  Palliative doctor present at bedside and is decided code status with family.  Continue to monitor.

## 2016-12-15 NOTE — Progress Notes (Signed)
Removed temporary diaylsis catheter.  No complications

## 2016-12-15 NOTE — Plan of Care (Signed)
Problem: Education: Goal: Knowledge of the prescribed therapeutic regimen will improve Outcome: Not Progressing Patient is having a difficult time understanding role of comfort care, educate frequently about medication regimen.

## 2016-12-16 MED ORDER — FUROSEMIDE 10 MG/ML IJ SOLN
80.0000 mg | Freq: Once | INTRAMUSCULAR | Status: AC
Start: 2016-12-16 — End: 2016-12-16
  Administered 2016-12-16: 80 mg via INTRAVENOUS
  Filled 2016-12-16: qty 8

## 2016-12-24 ENCOUNTER — Ambulatory Visit: Payer: Medicare Other | Admitting: Oncology

## 2016-12-25 ENCOUNTER — Ambulatory Visit: Payer: Medicare Other | Admitting: Oncology

## 2017-01-05 NOTE — Progress Notes (Signed)
Patient expired at 0440. Family at the bedside. MD notified to pronounce.

## 2017-01-05 NOTE — Progress Notes (Signed)
Notified Dr.Diamond about accumulation of fluid in lungs and throat, new orders placed.

## 2017-01-05 NOTE — Progress Notes (Signed)
Patient has been resting between care. Patient has pain with repositioning but has not requested pain medication when questioned. Patient is alert and oriented answers questions appropriately. Administered robinul for extra secretions. Patient has a non productive congested cough. Family member and visitors have multiple requests for staff members. Educated family member/ visitor about comfort regimen.

## 2017-01-05 NOTE — Discharge Summary (Signed)
   [facility]:   Death Note please see Last Note for all details.   Militza Devery [id number] is a 83 y.o. female, Outpatient Primary MD for the patient is Whitney Woods,BARBARA, MD   83 year old female with past medical history significant for CK D, history of breast cancer status post lumpectomy, congestive heart failure, hypertension, anemia admitted to hospital secondary to abnormal labs with potassium of 7 and acute renal failure.  Patient has been having abdominal pain for a couple of weeks and she was in the emergency room that required a CT scan. CT of the abdomen at the time revealed significant for toenail carcinomatosis. She saw Dr. Grayland Ormond as outpatient the day of admission and had labs done which showed abnormal labs including acute renal failure and hyperkalemia. Patient was directly sent in for direct admission.  Patient had emergency hemodialysis done-2 sessions. Throughout the hospital course patient had significantly worsening abdominal pain and also back pain. After 2 sessions of dialysis, her potassium and renal function was improving. And patient did not wish to be on dialysis. CT-guided biopsy of her omental mass was requested. However after oncology spoke with patient and her family, they have decided not to opt for further workup or treatment of her malignancy due to her poor quality of life and her wish to be comfortable. Palliative care has been consulted. Patient had several episodes of arrhythmias on the monitor-non-sustained ventricular tachycardia. She was made comfort care on 2016/12/22 evening. Patient passed away on Dec 23, 2016 at 4:40 AM   Pronounced dead on  23-Dec-2016    @      04:40AM            Cause of death :  1. Peritoneal carcinomatosis 2. Acute renal failure 3. Hyperkalemia 4. Anemia of chronic disease 5. Congestive heart failure 6. Hypertension 7. History of breast  cancer    Armida Vickroy M.D on 12-23-16 at 3:15 PM  [facility] [place] [facility] [place]  Total clinical and documentation time for today Under 30 minutes

## 2017-01-05 DEATH — deceased
